# Patient Record
Sex: Female | Born: 1975 | Race: White | Hispanic: No | Smoking: Current every day smoker
Health system: Southern US, Community
[De-identification: ages and names within clinical notes are randomized; demographics above are authoritative.]

## PROBLEM LIST (undated history)

## (undated) DIAGNOSIS — Z9221 Personal history of antineoplastic chemotherapy: Secondary | ICD-10-CM

## (undated) DIAGNOSIS — J45909 Unspecified asthma, uncomplicated: Secondary | ICD-10-CM

## (undated) DIAGNOSIS — F32A Depression, unspecified: Secondary | ICD-10-CM

## (undated) DIAGNOSIS — C801 Malignant (primary) neoplasm, unspecified: Secondary | ICD-10-CM

## (undated) DIAGNOSIS — C50919 Malignant neoplasm of unspecified site of unspecified female breast: Secondary | ICD-10-CM

## (undated) DIAGNOSIS — Z923 Personal history of irradiation: Secondary | ICD-10-CM

## (undated) HISTORY — DX: Depression, unspecified: F32.A

## (undated) HISTORY — PX: ABDOMINAL HYSTERECTOMY: SHX81

## (undated) HISTORY — PX: BREAST SURGERY: SHX581

---

## 2001-11-02 ENCOUNTER — Other Ambulatory Visit: Admission: RE | Admit: 2001-11-02 | Discharge: 2001-11-02 | Payer: Self-pay | Admitting: Family Medicine

## 2002-08-31 DIAGNOSIS — Z9221 Personal history of antineoplastic chemotherapy: Secondary | ICD-10-CM

## 2002-08-31 DIAGNOSIS — Z923 Personal history of irradiation: Secondary | ICD-10-CM

## 2002-08-31 DIAGNOSIS — C50919 Malignant neoplasm of unspecified site of unspecified female breast: Secondary | ICD-10-CM

## 2002-08-31 HISTORY — PX: BREAST LUMPECTOMY: SHX2

## 2002-08-31 HISTORY — DX: Malignant neoplasm of unspecified site of unspecified female breast: C50.919

## 2002-08-31 HISTORY — DX: Personal history of antineoplastic chemotherapy: Z92.21

## 2002-08-31 HISTORY — DX: Personal history of irradiation: Z92.3

## 2003-08-23 ENCOUNTER — Other Ambulatory Visit: Payer: Self-pay

## 2004-05-31 ENCOUNTER — Ambulatory Visit: Payer: Self-pay | Admitting: Oncology

## 2004-07-01 ENCOUNTER — Ambulatory Visit: Payer: Self-pay | Admitting: Oncology

## 2004-07-31 ENCOUNTER — Ambulatory Visit: Payer: Self-pay | Admitting: Oncology

## 2004-09-08 ENCOUNTER — Ambulatory Visit: Payer: Self-pay | Admitting: Oncology

## 2004-10-01 ENCOUNTER — Ambulatory Visit: Payer: Self-pay | Admitting: Oncology

## 2004-10-29 ENCOUNTER — Ambulatory Visit: Payer: Self-pay | Admitting: Oncology

## 2004-11-29 ENCOUNTER — Ambulatory Visit: Payer: Self-pay | Admitting: Oncology

## 2004-12-15 ENCOUNTER — Ambulatory Visit: Payer: Self-pay | Admitting: Surgery

## 2005-01-27 ENCOUNTER — Emergency Department: Payer: Self-pay | Admitting: Emergency Medicine

## 2005-01-29 ENCOUNTER — Ambulatory Visit: Payer: Self-pay | Admitting: Oncology

## 2005-02-28 ENCOUNTER — Ambulatory Visit: Payer: Self-pay | Admitting: Oncology

## 2005-05-13 ENCOUNTER — Ambulatory Visit: Payer: Self-pay | Admitting: Oncology

## 2005-05-29 ENCOUNTER — Ambulatory Visit: Payer: Self-pay | Admitting: Oncology

## 2005-05-31 ENCOUNTER — Ambulatory Visit: Payer: Self-pay | Admitting: Oncology

## 2005-08-31 HISTORY — PX: BREAST EXCISIONAL BIOPSY: SUR124

## 2005-09-03 ENCOUNTER — Ambulatory Visit: Payer: Self-pay | Admitting: Oncology

## 2005-10-01 ENCOUNTER — Ambulatory Visit: Payer: Self-pay | Admitting: Oncology

## 2005-10-22 ENCOUNTER — Emergency Department: Payer: Self-pay | Admitting: Emergency Medicine

## 2005-10-22 ENCOUNTER — Other Ambulatory Visit: Payer: Self-pay

## 2005-11-02 ENCOUNTER — Ambulatory Visit: Payer: Self-pay | Admitting: Oncology

## 2005-11-04 ENCOUNTER — Ambulatory Visit: Payer: Self-pay | Admitting: Oncology

## 2005-11-16 ENCOUNTER — Ambulatory Visit: Payer: Self-pay | Admitting: Surgery

## 2005-11-20 ENCOUNTER — Ambulatory Visit: Payer: Self-pay | Admitting: Oncology

## 2005-12-16 ENCOUNTER — Ambulatory Visit: Payer: Self-pay | Admitting: Oncology

## 2005-12-29 ENCOUNTER — Ambulatory Visit: Payer: Self-pay | Admitting: Oncology

## 2006-02-01 ENCOUNTER — Emergency Department: Payer: Self-pay | Admitting: Emergency Medicine

## 2006-02-01 ENCOUNTER — Other Ambulatory Visit: Payer: Self-pay

## 2006-04-01 ENCOUNTER — Ambulatory Visit: Payer: Self-pay | Admitting: Oncology

## 2006-05-01 ENCOUNTER — Ambulatory Visit: Payer: Self-pay | Admitting: Oncology

## 2006-08-02 ENCOUNTER — Ambulatory Visit: Payer: Self-pay | Admitting: Oncology

## 2006-08-20 ENCOUNTER — Ambulatory Visit: Payer: Self-pay | Admitting: Oncology

## 2006-08-31 ENCOUNTER — Ambulatory Visit: Payer: Self-pay | Admitting: Oncology

## 2006-08-31 HISTORY — PX: BREAST EXCISIONAL BIOPSY: SUR124

## 2006-11-12 ENCOUNTER — Ambulatory Visit: Payer: Self-pay | Admitting: Oncology

## 2006-11-23 ENCOUNTER — Ambulatory Visit: Payer: Self-pay | Admitting: Oncology

## 2006-11-29 ENCOUNTER — Ambulatory Visit: Payer: Self-pay | Admitting: Oncology

## 2006-11-30 ENCOUNTER — Ambulatory Visit: Payer: Self-pay | Admitting: Oncology

## 2006-12-09 ENCOUNTER — Ambulatory Visit: Payer: Self-pay | Admitting: Surgery

## 2006-12-22 ENCOUNTER — Ambulatory Visit: Payer: Self-pay | Admitting: Oncology

## 2006-12-30 ENCOUNTER — Ambulatory Visit: Payer: Self-pay | Admitting: Oncology

## 2006-12-30 ENCOUNTER — Encounter: Admission: RE | Admit: 2006-12-30 | Payer: Self-pay | Admitting: Unknown Physician Specialty

## 2007-01-05 ENCOUNTER — Ambulatory Visit: Payer: Self-pay | Admitting: Oncology

## 2007-01-27 ENCOUNTER — Ambulatory Visit: Payer: Self-pay | Admitting: General Surgery

## 2007-01-30 ENCOUNTER — Ambulatory Visit: Payer: Self-pay | Admitting: Radiation Oncology

## 2007-01-30 ENCOUNTER — Ambulatory Visit: Payer: Self-pay | Admitting: Oncology

## 2007-02-02 ENCOUNTER — Ambulatory Visit: Payer: Self-pay | Admitting: General Surgery

## 2007-03-01 ENCOUNTER — Ambulatory Visit: Payer: Self-pay | Admitting: Oncology

## 2007-03-01 ENCOUNTER — Ambulatory Visit: Payer: Self-pay | Admitting: Radiation Oncology

## 2007-04-01 ENCOUNTER — Ambulatory Visit: Payer: Self-pay | Admitting: Oncology

## 2007-04-20 ENCOUNTER — Ambulatory Visit: Payer: Self-pay | Admitting: Oncology

## 2007-05-02 ENCOUNTER — Ambulatory Visit: Payer: Self-pay | Admitting: Oncology

## 2007-07-02 ENCOUNTER — Ambulatory Visit: Payer: Self-pay | Admitting: Oncology

## 2007-07-20 ENCOUNTER — Ambulatory Visit: Payer: Self-pay | Admitting: Oncology

## 2007-08-01 ENCOUNTER — Ambulatory Visit: Payer: Self-pay | Admitting: Oncology

## 2007-09-01 ENCOUNTER — Ambulatory Visit: Payer: Self-pay | Admitting: Oncology

## 2007-10-02 ENCOUNTER — Ambulatory Visit: Payer: Self-pay | Admitting: Oncology

## 2007-10-30 ENCOUNTER — Ambulatory Visit: Payer: Self-pay | Admitting: Oncology

## 2007-12-30 ENCOUNTER — Ambulatory Visit: Payer: Self-pay | Admitting: Oncology

## 2008-01-19 ENCOUNTER — Ambulatory Visit: Payer: Self-pay | Admitting: Oncology

## 2008-01-30 ENCOUNTER — Ambulatory Visit: Payer: Self-pay | Admitting: Oncology

## 2008-02-29 ENCOUNTER — Ambulatory Visit: Payer: Self-pay | Admitting: Oncology

## 2008-05-01 ENCOUNTER — Ambulatory Visit: Payer: Self-pay | Admitting: Oncology

## 2008-05-08 ENCOUNTER — Ambulatory Visit: Payer: Self-pay | Admitting: Oncology

## 2008-05-31 ENCOUNTER — Ambulatory Visit: Payer: Self-pay | Admitting: Oncology

## 2008-09-17 ENCOUNTER — Ambulatory Visit: Payer: Self-pay | Admitting: Oncology

## 2008-10-01 ENCOUNTER — Ambulatory Visit: Payer: Self-pay | Admitting: Oncology

## 2008-10-03 ENCOUNTER — Ambulatory Visit: Payer: Self-pay | Admitting: Oncology

## 2008-10-29 ENCOUNTER — Ambulatory Visit: Payer: Self-pay | Admitting: Oncology

## 2009-02-13 ENCOUNTER — Emergency Department: Payer: Self-pay | Admitting: Emergency Medicine

## 2009-04-16 ENCOUNTER — Ambulatory Visit: Payer: Self-pay | Admitting: Oncology

## 2009-05-01 ENCOUNTER — Ambulatory Visit: Payer: Self-pay | Admitting: Oncology

## 2009-05-08 ENCOUNTER — Ambulatory Visit: Payer: Self-pay | Admitting: Oncology

## 2009-05-31 ENCOUNTER — Ambulatory Visit: Payer: Self-pay | Admitting: Oncology

## 2009-10-29 ENCOUNTER — Ambulatory Visit: Payer: Self-pay | Admitting: Oncology

## 2009-11-05 ENCOUNTER — Ambulatory Visit: Payer: Self-pay | Admitting: Oncology

## 2009-11-29 ENCOUNTER — Ambulatory Visit: Payer: Self-pay | Admitting: Oncology

## 2010-04-17 ENCOUNTER — Ambulatory Visit: Payer: Self-pay | Admitting: Oncology

## 2010-07-03 ENCOUNTER — Ambulatory Visit: Payer: Self-pay | Admitting: Oncology

## 2010-07-31 ENCOUNTER — Ambulatory Visit: Payer: Self-pay | Admitting: Oncology

## 2010-11-18 ENCOUNTER — Emergency Department: Payer: Self-pay | Admitting: Emergency Medicine

## 2011-01-19 ENCOUNTER — Ambulatory Visit: Payer: Self-pay | Admitting: Oncology

## 2011-01-20 LAB — CANCER ANTIGEN 27.29: CA 27.29: 15.7 U/mL (ref 0.0–38.6)

## 2011-01-30 ENCOUNTER — Ambulatory Visit: Payer: Self-pay | Admitting: Oncology

## 2011-04-20 ENCOUNTER — Ambulatory Visit: Payer: Self-pay | Admitting: Oncology

## 2011-07-28 ENCOUNTER — Ambulatory Visit: Payer: Self-pay | Admitting: Oncology

## 2011-07-29 LAB — CANCER ANTIGEN 27.29: CA 27.29: 13.6 U/mL (ref 0.0–38.6)

## 2011-08-01 ENCOUNTER — Ambulatory Visit: Payer: Self-pay | Admitting: Oncology

## 2011-10-03 ENCOUNTER — Emergency Department: Payer: Self-pay | Admitting: Emergency Medicine

## 2011-10-12 ENCOUNTER — Ambulatory Visit: Payer: Self-pay | Admitting: Oncology

## 2011-10-30 ENCOUNTER — Ambulatory Visit: Payer: Self-pay | Admitting: Oncology

## 2012-04-21 ENCOUNTER — Ambulatory Visit: Payer: Self-pay | Admitting: Oncology

## 2012-05-02 ENCOUNTER — Emergency Department: Payer: Self-pay | Admitting: Internal Medicine

## 2012-05-19 ENCOUNTER — Ambulatory Visit: Payer: Self-pay | Admitting: Oncology

## 2012-05-31 ENCOUNTER — Ambulatory Visit: Payer: Self-pay | Admitting: Oncology

## 2013-04-24 ENCOUNTER — Ambulatory Visit: Payer: Self-pay | Admitting: Oncology

## 2013-05-01 ENCOUNTER — Ambulatory Visit: Payer: Self-pay | Admitting: Oncology

## 2013-05-31 ENCOUNTER — Ambulatory Visit: Payer: Self-pay | Admitting: Oncology

## 2013-10-16 ENCOUNTER — Emergency Department: Payer: Self-pay | Admitting: Emergency Medicine

## 2014-06-20 ENCOUNTER — Ambulatory Visit: Payer: Self-pay | Admitting: Oncology

## 2014-06-28 ENCOUNTER — Ambulatory Visit: Payer: Self-pay | Admitting: Oncology

## 2014-07-06 ENCOUNTER — Ambulatory Visit: Payer: Self-pay | Admitting: Oncology

## 2014-07-10 LAB — CANCER ANTIGEN 27.29: CA 27.29: 11.1 U/mL (ref 0.0–38.6)

## 2014-07-31 ENCOUNTER — Ambulatory Visit: Payer: Self-pay | Admitting: Oncology

## 2014-12-14 ENCOUNTER — Other Ambulatory Visit: Payer: Self-pay | Admitting: Oncology

## 2014-12-14 DIAGNOSIS — Z1231 Encounter for screening mammogram for malignant neoplasm of breast: Secondary | ICD-10-CM

## 2015-05-11 ENCOUNTER — Ambulatory Visit: Payer: Medicaid Other

## 2015-05-11 ENCOUNTER — Ambulatory Visit
Admission: EM | Admit: 2015-05-11 | Discharge: 2015-05-11 | Disposition: A | Discharge status: Home / Self Care | Payer: Medicaid Other | Attending: Family Medicine | Admitting: Family Medicine

## 2015-05-11 ENCOUNTER — Encounter: Payer: Self-pay | Admitting: Gynecology

## 2015-05-11 DIAGNOSIS — S299XXA Unspecified injury of thorax, initial encounter: Secondary | ICD-10-CM | POA: Insufficient documentation

## 2015-05-11 DIAGNOSIS — S62604A Fracture of unspecified phalanx of right ring finger, initial encounter for closed fracture: Secondary | ICD-10-CM

## 2015-05-11 DIAGNOSIS — S62635A Displaced fracture of distal phalanx of left ring finger, initial encounter for closed fracture: Secondary | ICD-10-CM | POA: Insufficient documentation

## 2015-05-11 DIAGNOSIS — F17210 Nicotine dependence, cigarettes, uncomplicated: Secondary | ICD-10-CM | POA: Insufficient documentation

## 2015-05-11 DIAGNOSIS — M79642 Pain in left hand: Secondary | ICD-10-CM | POA: Diagnosis present

## 2015-05-11 DIAGNOSIS — R079 Chest pain, unspecified: Secondary | ICD-10-CM | POA: Diagnosis present

## 2015-05-11 DIAGNOSIS — R059 Cough, unspecified: Secondary | ICD-10-CM

## 2015-05-11 DIAGNOSIS — R05 Cough: Secondary | ICD-10-CM | POA: Diagnosis not present

## 2015-05-11 HISTORY — DX: Malignant (primary) neoplasm, unspecified: C80.1

## 2015-05-11 MED ORDER — IBUPROFEN 800 MG PO TABS: 800.0000 mg | ORAL_TABLET | Freq: Three times a day (TID) | ORAL | Status: DC

## 2015-05-11 NOTE — Discharge Instructions (Signed)
Blunt Chest Trauma Blunt chest trauma is an injury caused by a blow to the chest. These chest injuries can be very painful. Blunt chest trauma often results in bruised or broken (fractured) ribs. Most cases of bruised and fractured ribs from blunt chest traumas get better after 1 to 3 weeks of rest and pain medicine. Often, the soft tissue in the chest wall is also injured, causing pain and bruising. Internal organs, such as the heart and lungs, may also be injured. Blunt chest trauma can lead to serious medical problems. This injury requires immediate medical care. CAUSES   Motor vehicle collisions.  Falls.  Physical violence.  Sports injuries. SYMPTOMS   Chest pain. The pain may be worse when you move or breathe deeply.  Shortness of breath.  Lightheadedness.  Bruising.  Tenderness.  Swelling. DIAGNOSIS  Your caregiver will do a physical exam. X-rays may be taken to look for fractures. However, minor rib fractures may not show up on X-rays until a few days after the injury. If a more serious injury is suspected, further imaging tests may be done. This may include ultrasounds, computed tomography (CT) scans, or magnetic resonance imaging (MRI). TREATMENT  Treatment depends on the severity of your injury. Your caregiver may prescribe pain medicines and deep breathing exercises. HOME CARE INSTRUCTIONS  Limit your activities until you can move around without much pain.  Do not do any strenuous work until your injury is healed.  Put ice on the injured area.  Put ice in a plastic bag.  Place a towel between your skin and the bag.  Leave the ice on for 15-20 minutes, 03-04 times a day.  You may wear a rib belt as directed by your caregiver to reduce pain.  Practice deep breathing as directed by your caregiver to keep your lungs clear.  Only take over-the-counter or prescription medicines for pain, fever, or discomfort as directed by your caregiver. SEEK IMMEDIATE MEDICAL  CARE IF:   You have increasing pain or shortness of breath.  You cough up blood.  You have nausea, vomiting, or abdominal pain.  You have a fever.  You feel dizzy, weak, or you faint. MAKE SURE YOU:  Understand these instructions.  Will watch your condition.  Will get help right away if you are not doing well or get worse. Document Released: 09/24/2004 Document Revised: 11/09/2011 Document Reviewed: 06/03/2011 [facility] Patient Information 2015 [place], [place]. This information is not intended to replace advice given to you by your health care provider. Make sure you discuss any questions you have with your health care provider.  Cast or Splint Care Casts and splints support injured limbs and keep bones from moving while they heal. It is important to care for your cast or splint at home.  HOME CARE INSTRUCTIONS  Keep the cast or splint uncovered during the drying period. It can take 24 to 48 hours to dry if it is made of plaster. A fiberglass cast will dry in less than 1 hour.  Do not rest the cast on anything harder than a pillow for the first 24 hours.  Do not put weight on your injured limb or apply pressure to the cast until your health care provider gives you permission.  Keep the cast or splint dry. Wet casts or splints can lose their shape and may not support the limb as well. A wet cast that has lost its shape can also create harmful pressure on your skin when it dries. Also, wet skin can  become infected.  Cover the cast or splint with a plastic bag when bathing or when out in the rain or snow. If the cast is on the trunk of the body, take sponge baths until the cast is removed.  If your cast does become wet, dry it with a towel or a blow dryer on the cool setting only.  Keep your cast or splint clean. Soiled casts may be wiped with a moistened cloth.  Do not place any hard or soft foreign objects under your cast or splint, such as cotton, toilet paper, lotion, or  powder.  Do not try to scratch the skin under the cast with any object. The object could get stuck inside the cast. Also, scratching could lead to an infection. If itching is a problem, use a blow dryer on a cool setting to relieve discomfort.  Do not trim or cut your cast or remove padding from inside of it.  Exercise all joints next to the injury that are not immobilized by the cast or splint. For example, if you have a long leg cast, exercise the hip joint and toes. If you have an arm cast or splint, exercise the shoulder, elbow, thumb, and fingers.  Elevate your injured arm or leg on 1 or 2 pillows for the first 1 to 3 days to decrease swelling and pain.It is best if you can comfortably elevate your cast so it is higher than your heart. SEEK MEDICAL CARE IF:   Your cast or splint cracks.  Your cast or splint is too tight or too loose.  You have unbearable itching inside the cast.  Your cast becomes wet or develops a soft spot or area.  You have a bad smell coming from inside your cast.  You get an object stuck under your cast.  Your skin around the cast becomes red or raw.  You have new pain or worsening pain after the cast has been applied. SEEK IMMEDIATE MEDICAL CARE IF:   You have fluid leaking through the cast.  You are unable to move your fingers or toes.  You have discolored (blue or white), cool, painful, or very swollen fingers or toes beyond the cast.  You have tingling or numbness around the injured area.  You have severe pain or pressure under the cast.  You have any difficulty with your breathing or have shortness of breath.  You have chest pain. Document Released: 08/14/2000 Document Revised: 06/07/2013 Document Reviewed: 02/23/2013 [facility] Patient Information 2015 [place], [place]. This information is not intended to replace advice given to you by your health care provider. Make sure you discuss any questions you have with your health care provider. Hand  Contusion A hand contusion is a deep bruise on your hand area. Contusions are the result of an injury that caused bleeding under the skin. The contusion may turn blue, purple, or yellow. Minor injuries will give you a painless contusion, but more severe contusions may stay painful and swollen for a few weeks. CAUSES  A contusion is usually caused by a blow, trauma, or direct force to an area of the body. SYMPTOMS   Swelling and redness of the injured area.  Discoloration of the injured area.  Tenderness and soreness of the injured area.  Pain. DIAGNOSIS  The diagnosis can be made by taking a history and performing a physical exam. An X-ray, CT scan, or MRI may be needed to determine if there were any associated injuries, such as broken bones (fractures). TREATMENT  Often, the best treatment for a hand contusion is resting, elevating, icing, and applying cold compresses to the injured area. Over-the-counter medicines may also be recommended for pain control. HOME CARE INSTRUCTIONS   Put ice on the injured area.  Put ice in a plastic bag.  Place a towel between your skin and the bag.  Leave the ice on for 15-20 minutes, 03-04 times a day.  Only take over-the-counter or prescription medicines as directed by your caregiver. Your caregiver may recommend avoiding anti-inflammatory medicines (aspirin, ibuprofen, and naproxen) for 48 hours because these medicines may increase bruising.  If told, use an elastic wrap as directed. This can help reduce swelling. You may remove the wrap for sleeping, showering, and bathing. If your fingers become numb, cold, or blue, take the wrap off and reapply it more loosely.  Elevate your hand with pillows to reduce swelling.  Avoid overusing your hand if it is painful. SEEK IMMEDIATE MEDICAL CARE IF:   You have increased redness, swelling, or pain in your hand.  Your swelling or pain is not relieved with medicines.  You have loss of feeling in your  hand or are unable to move your fingers.  Your hand turns cold or blue.  You have pain when you move your fingers.  Your hand becomes warm to the touch.  Your contusion does not improve in 2 days. MAKE SURE YOU:   Understand these instructions.  Will watch your condition.  Will get help right away if you are not doing well or get worse. Document Released: 02/06/2002 Document Revised: 05/11/2012 Document Reviewed: 02/08/2012 [facility] Patient Information 2015 [place], [place]. This information is not intended to replace advice given to you by your health care provider. Make sure you discuss any questions you have with your health care provider. Chest Contusion A chest contusion is a deep bruise on your chest area. Contusions are the result of an injury that caused bleeding under the skin. A chest contusion may involve bruising of the skin, muscles, or ribs. The contusion may turn blue, purple, or yellow. Minor injuries will give you a painless contusion, but more severe contusions may stay painful and swollen for a few weeks. CAUSES  A contusion is usually caused by a blow, trauma, or direct force to an area of the body. SYMPTOMS   Swelling and redness of the injured area.  Discoloration of the injured area.  Tenderness and soreness of the injured area.  Pain. DIAGNOSIS  The diagnosis can be made by taking a history and performing a physical exam. An X-ray, CT scan, or MRI may be needed to determine if there were any associated injuries, such as broken bones (fractures) or internal injuries. TREATMENT  Often, the best treatment for a chest contusion is resting, icing, and applying cold compresses to the injured area. Deep breathing exercises may be recommended to reduce the risk of pneumonia. Over-the-counter medicines may also be recommended for pain control. HOME CARE INSTRUCTIONS   Put ice on the injured area.  Put ice in a plastic bag.  Place a towel between your skin and  the bag.  Leave the ice on for 15-20 minutes, 03-04 times a day.  Only take over-the-counter or prescription medicines as directed by your caregiver. Your caregiver may recommend avoiding anti-inflammatory medicines (aspirin, ibuprofen, and naproxen) for 48 hours because these medicines may increase bruising.  Rest the injured area.  Perform deep-breathing exercises as directed by your caregiver.  Stop smoking if you smoke.  Do  not lift objects over 5 pounds (2.3 kg) for 3 days or longer if recommended by your caregiver. SEEK IMMEDIATE MEDICAL CARE IF:   You have increased bruising or swelling.  You have pain that is getting worse.  You have difficulty breathing.  You have dizziness, weakness, or fainting.  You have blood in your urine or stool.  You cough up or vomit blood.  Your swelling or pain is not relieved with medicines. MAKE SURE YOU:   Understand these instructions.  Will watch your condition.  Will get help right away if you are not doing well or get worse. Document Released: 05/12/2001 Document Revised: 05/11/2012 Document Reviewed: 02/08/2012 [facility] Patient Information 2015 [place], [place]. This information is not intended to replace advice given to you by your health care provider. Make sure you discuss any questions you have with your health care provider. Finger Fracture (Phalangeal) A broken bone of the finger (phalangealfracture) is a common injury for athletes. A single injury (trauma) is likely to fracture multiple bones on the same or different fingers. SYMPTOMS   Severe pain, at the time of injury.  Pain, tenderness, swelling, and later bruising of the finger and then the hand.  Visible deformity, if the fracture is complete and the bone fragments separate enough to distort the normal shape.  Numbness or coldness from swelling in the finger, causing pressure on blood vessels or nerves (uncommon). CAUSES  Direct or indirect injury (trauma) to  the finger.  RISK INCREASES WITH:   Contact sports (football, rugby) or other sports where injury to the hand is likely (soccer, baseball, basketball).  Sports that require hitting (boxing, martial arts).  History of bone or joint disease, such as osteoporosis, or previous bone restraint.  Poor hand strength and flexibility. PREVENTION   For contact sports, wear appropriate and properly fitted protective equipment for the hand.  Learn and use proper technique when hitting, punching, or landing after a fall.  If you had a previous finger injury or hand restraint, use tape or padding to protect the finger when playing sports where finger injury is likely. PROGNOSIS  With proper treatment and normal alignment of the bones, healing can usually be expected in 4 to 6 weeks. Sometimes, surgery is needed.  RELATED COMPLICATIONS   Fracture does not heal (nonunion).  Bone heals in wrong position (malunion).  Chronic pain, stiffness, or swelling of the hand.  Excessive bleeding, causing pressure on nerves and blood vessels.  Unstable or arthritic joint, following repeated injury or delayed treatment.  Hindrance of normal growth in children.  Infection in skin broken over the fracture (open fracture) or at the incision or pin sites from surgery.  Shortening of injured bones.  Bony bumps or loss of shape of the fingers.  Arthritic or stiff finger joint, if the fracture reaches the joint. TREATMENT  If the bones are properly aligned, treatment involves ice and medicine to reduce pain and inflammation. Then, the finger is restrained for 4 or more weeks, to allow for healing. If the fracture is out of alignment (displaced), involves more than one bone, or involves a joint, surgery is usually advised. Surgery often involves placing removable pins, screws, and sometimes plates, to hold the bones in proper alignment. After restraint (with or without surgery), stretching and strengthening  exercises are needed. Exercises may be completed at home or with a therapist. For certain sports, wearing a splint or having the finger taped during future activity is advised.  MEDICATION   If pain  medicine is needed, nonsteroidal anti-inflammatory medicines (aspirin and ibuprofen), or other minor pain relievers (acetaminophen), are often advised.  Do not take pain medicine for 7 days before surgery.  Prescription pain relievers are usually prescribed only after surgery. Use only as directed and only as much as you need. COLD THERAPY   Cold treatment (icing) relieves pain and reduces inflammation. Cold treatment should be applied for 10 to 15 minutes every 2 to 3 hours, and immediately after activity that aggravates your symptoms. Use ice packs or an ice massage. SEEK MEDICAL CARE IF:   Pain, tenderness, or swelling gets worse, despite treatment.  You experience pain, numbness, or coldness in the hand.  Blue, gray, or dark color appears in the fingernails.  Any of the following occur after surgery: fever, increased pain, swelling, redness, drainage of fluids, or bleeding in the affected area.  New, unexplained symptoms develop. (Drugs used in treatment may produce side effects.) Document Released: 08/17/2005 Document Revised: 11/09/2011 Document Reviewed: 11/29/2008 [facility] Patient Information 2015 [place], [place]. This information is not intended to replace advice given to you by your health care provider. Make sure you discuss any questions you have with your health care provider.
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
## 2015-06-14 ENCOUNTER — Emergency Department
Admission: EM | Admit: 2015-06-14 | Discharge: 2015-06-14 | Disposition: A | Discharge status: Home / Self Care | Payer: No Typology Code available for payment source | Attending: Emergency Medicine | Admitting: Emergency Medicine

## 2015-06-14 ENCOUNTER — Emergency Department: Payer: No Typology Code available for payment source

## 2015-06-14 ENCOUNTER — Encounter: Payer: Self-pay | Admitting: Emergency Medicine

## 2015-06-14 DIAGNOSIS — M62838 Other muscle spasm: Secondary | ICD-10-CM | POA: Diagnosis not present

## 2015-06-14 DIAGNOSIS — Z79899 Other long term (current) drug therapy: Secondary | ICD-10-CM | POA: Diagnosis not present

## 2015-06-14 DIAGNOSIS — Y9389 Activity, other specified: Secondary | ICD-10-CM | POA: Diagnosis not present

## 2015-06-14 DIAGNOSIS — Y92410 Unspecified street and highway as the place of occurrence of the external cause: Secondary | ICD-10-CM | POA: Diagnosis not present

## 2015-06-14 DIAGNOSIS — S4991XA Unspecified injury of right shoulder and upper arm, initial encounter: Secondary | ICD-10-CM | POA: Diagnosis not present

## 2015-06-14 DIAGNOSIS — M62830 Muscle spasm of back: Secondary | ICD-10-CM | POA: Diagnosis not present

## 2015-06-14 DIAGNOSIS — Z720 Tobacco use: Secondary | ICD-10-CM | POA: Insufficient documentation

## 2015-06-14 DIAGNOSIS — S6000XA Contusion of unspecified finger without damage to nail, initial encounter: Secondary | ICD-10-CM

## 2015-06-14 DIAGNOSIS — S6992XA Unspecified injury of left wrist, hand and finger(s), initial encounter: Secondary | ICD-10-CM | POA: Diagnosis present

## 2015-06-14 DIAGNOSIS — Y998 Other external cause status: Secondary | ICD-10-CM | POA: Insufficient documentation

## 2015-06-14 DIAGNOSIS — S60042A Contusion of left ring finger without damage to nail, initial encounter: Secondary | ICD-10-CM | POA: Diagnosis not present

## 2015-06-14 MED ORDER — CYCLOBENZAPRINE HCL 10 MG PO TABS: 10.0000 mg | ORAL_TABLET | Freq: Three times a day (TID) | ORAL | Status: DC | PRN

## 2015-06-14 MED ORDER — MELOXICAM 15 MG PO TABS: 15.0000 mg | ORAL_TABLET | Freq: Every day | ORAL | Status: DC
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
## 2015-06-14 NOTE — Discharge Instructions (Signed)
Back Exercises The following exercises strengthen the muscles that help to support the back. They also help to keep the lower back flexible. Doing these exercises can help to prevent back pain or lessen existing pain. If you have back pain or discomfort, try doing these exercises 2-3 times each day or as told by your health care provider. When the pain goes away, do them once each day, but increase the number of times that you repeat the steps for each exercise (do more repetitions). If you do not have back pain or discomfort, do these exercises once each day or as told by your health care provider. EXERCISES Single Knee to Chest Repeat these steps 3-5 times for each leg:  Lie on your back on a firm bed or the floor with your legs extended.  Bring one knee to your chest. Your other leg should stay extended and in contact with the floor.  Hold your knee in place by grabbing your knee or thigh.  Pull on your knee until you feel a gentle stretch in your lower back.  Hold the stretch for 10-30 seconds.  Slowly release and straighten your leg. Pelvic Tilt Repeat these steps 5-10 times:  Lie on your back on a firm bed or the floor with your legs extended.  Bend your knees so they are pointing toward the ceiling and your feet are flat on the floor.  Tighten your lower abdominal muscles to press your lower back against the floor. This motion will tilt your pelvis so your tailbone points up toward the ceiling instead of pointing to your feet or the floor.  With gentle tension and even breathing, hold this position for 5-10 seconds. Cat-Cow Repeat these steps until your lower back becomes more flexible:  Get into a hands-and-knees position on a firm surface. Keep your hands under your shoulders, and keep your knees under your hips. You may place padding under your knees for comfort.  Let your head hang down, and point your tailbone toward the floor so your lower back becomes rounded like the  back of a cat.  Hold this position for 5 seconds.  Slowly lift your head and point your tailbone up toward the ceiling so your back forms a sagging arch like the back of a cow.  Hold this position for 5 seconds. Press-Ups Repeat these steps 5-10 times:  Lie on your abdomen (face-down) on the floor.  Place your palms near your head, about shoulder-width apart.  While you keep your back as relaxed as possible and keep your hips on the floor, slowly straighten your arms to raise the top half of your body and lift your shoulders. Do not use your back muscles to raise your upper torso. You may adjust the placement of your hands to make yourself more comfortable.  Hold this position for 5 seconds while you keep your back relaxed.  Slowly return to lying flat on the floor. Bridges Repeat these steps 10 times:  Lie on your back on a firm surface.  Bend your knees so they are pointing toward the ceiling and your feet are flat on the floor.  Tighten your buttocks muscles and lift your buttocks off of the floor until your waist is at almost the same height as your knees. You should feel the muscles working in your buttocks and the back of your thighs. If you do not feel these muscles, slide your feet 1-2 inches farther away from your buttocks.  Hold this position for 3-5  seconds.  Slowly lower your hips to the starting position, and allow your buttocks muscles to relax completely. If this exercise is too easy, try doing it with your arms crossed over your chest. Abdominal Crunches Repeat these steps 5-10 times: 1. Lie on your back on a firm bed or the floor with your legs extended. 2. Bend your knees so they are pointing toward the ceiling and your feet are flat on the floor. 3. Cross your arms over your chest. 4. Tip your chin slightly toward your chest without bending your neck. 5. Tighten your abdominal muscles and slowly raise your trunk (torso) high enough to lift your shoulder blades  a tiny bit off of the floor. Avoid raising your torso higher than that, because it can put too much stress on your low back and it does not help to strengthen your abdominal muscles. 6. Slowly return to your starting position. Back Lifts Repeat these steps 5-10 times: 1. Lie on your abdomen (face-down) with your arms at your sides, and rest your forehead on the floor. 2. Tighten the muscles in your legs and your buttocks. 3. Slowly lift your chest off of the floor while you keep your hips pressed to the floor. Keep the back of your head in line with the curve in your back. Your eyes should be looking at the floor. 4. Hold this position for 3-5 seconds. 5. Slowly return to your starting position. SEEK MEDICAL CARE IF:  Your back pain or discomfort gets much worse when you do an exercise.  Your back pain or discomfort does not lessen within 2 hours after you exercise. If you have any of these problems, stop doing these exercises right away. Do not do them again unless your health care provider says that you can. SEEK IMMEDIATE MEDICAL CARE IF:  You develop sudden, severe back pain. If this happens, stop doing the exercises right away. Do not do them again unless your health care provider says that you can.   This information is not intended to replace advice given to you by your health care provider. Make sure you discuss any questions you have with your health care provider.   Document Released: 09/24/2004 Document Revised: 05/08/2015 Document Reviewed: 10/11/2014 Elsevier Interactive Patient Education 2016 [place] therapy can help ease sore, stiff, injured, and tight muscles and joints. Heat relaxes your muscles, which may help ease your pain.  RISKS AND COMPLICATIONS If you have any of the following conditions, do not use heat therapy unless your health care provider has approved:  Poor circulation.  Healing wounds or scarred skin in the area being  treated.  Diabetes, heart disease, or high blood pressure.  Not being able to feel (numbness) the area being treated.  Unusual swelling of the area being treated.  Active infections.  Blood clots.  Cancer.  Inability to communicate pain. This may include young children and people who have problems with their brain function (dementia).  Pregnancy. Heat therapy should only be used on old, pre-existing, or long-lasting (chronic) injuries. Do not use heat therapy on new injuries unless directed by your health care provider. HOW TO USE HEAT THERAPY There are several different kinds of heat therapy, including:  Moist heat pack.  Warm water bath.  Hot water bottle.  Electric heating pad.  Heated gel pack.  Heated wrap.  Electric heating pad. Use the heat therapy method suggested by your health care provider. Follow your health care provider's instructions on when  and how to use heat therapy. GENERAL HEAT THERAPY RECOMMENDATIONS  Do not sleep while using heat therapy. Only use heat therapy while you are awake.  Your skin may turn pink while using heat therapy. Do not use heat therapy if your skin turns red.  Do not use heat therapy if you have new pain.  High heat or long exposure to heat can cause burns. Be careful when using heat therapy to avoid burning your skin.  Do not use heat therapy on areas of your skin that are already irritated, such as with a rash or sunburn. SEEK MEDICAL CARE IF:  You have blisters, redness, swelling, or numbness.  You have new pain.  Your pain is worse. MAKE SURE YOU:  Understand these instructions.  Will watch your condition.  Will get help right away if you are not doing well or get worse.   This information is not intended to replace advice given to you by your health care provider. Make sure you discuss any questions you have with your health care provider.   Document Released: 11/09/2011 Document Revised: 09/07/2014 Document  Reviewed: 10/10/2013 Elsevier Interactive Patient Education 2016 [employer]  Muscle Cramps and Spasms Muscle cramps and spasms occur when a muscle or muscles tighten and you have no control over this tightening (involuntary muscle contraction). They are a common problem and can develop in any muscle. The most common place is in the calf muscles of the leg. Both muscle cramps and muscle spasms are involuntary muscle contractions, but they also have differences:   Muscle cramps are sporadic and painful. They may last a few seconds to a quarter of an hour. Muscle cramps are often more forceful and last longer than muscle spasms.  Muscle spasms may or may not be painful. They may also last just a few seconds or much longer. CAUSES  It is uncommon for cramps or spasms to be due to a serious underlying problem. In many cases, the cause of cramps or spasms is unknown. Some common causes are:   Overexertion.   Overuse from repetitive motions (doing the same thing over and over).   Remaining in a certain position for a long period of time.   Improper preparation, form, or technique while performing a sport or activity.   Dehydration.   Injury.   Side effects of some medicines.   Abnormally low levels of the salts and ions in your blood (electrolytes), especially potassium and calcium. This could happen if you are taking water pills (diuretics) or you are pregnant.  Some underlying medical problems can make it more likely to develop cramps or spasms. These include, but are not limited to:   Diabetes.   Parkinson disease.   Hormone disorders, such as thyroid problems.   Alcohol abuse.   Diseases specific to muscles, joints, and bones.   Blood vessel disease where not enough blood is getting to the muscles.  HOME CARE INSTRUCTIONS   Stay well hydrated. Drink enough water and fluids to keep your urine clear or pale yellow.  It may be helpful to massage, stretch, and  relax the affected muscle.  For tight or tense muscles, use a warm towel, heating pad, or hot shower water directed to the affected area.  If you are sore or have pain after a cramp or spasm, applying ice to the affected area may relieve discomfort.  Put ice in a plastic bag.  Place a towel between your skin and the bag.  Leave the ice  on for 15-20 minutes, 03-04 times a day.  Medicines used to treat a known cause of cramps or spasms may help reduce their frequency or severity. Only take over-the-counter or prescription medicines as directed by your caregiver. SEEK MEDICAL CARE IF:  Your cramps or spasms get more severe, more frequent, or do not improve over time.  MAKE SURE YOU:   Understand these instructions.  Will watch your condition.  Will get help right away if you are not doing well or get worse.   This information is not intended to replace advice given to you by your health care provider. Make sure you discuss any questions you have with your health care provider.   Document Released: 02/06/2002 Document Revised: 12/12/2012 Document Reviewed: 08/03/2012 Elsevier Interactive Patient Education [employer].
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
## 2015-06-25 ENCOUNTER — Ambulatory Visit
Admission: RE | Admit: 2015-06-25 | Discharge: 2015-06-25 | Disposition: A | Discharge status: Home / Self Care | Payer: Medicaid Other | Source: Ambulatory Visit | Attending: Oncology | Admitting: Oncology

## 2015-06-25 DIAGNOSIS — Z1231 Encounter for screening mammogram for malignant neoplasm of breast: Secondary | ICD-10-CM | POA: Insufficient documentation

## 2015-06-25 HISTORY — DX: Malignant neoplasm of unspecified site of unspecified female breast: C50.919

## 2015-07-03 ENCOUNTER — Inpatient Hospital Stay: Payer: No Typology Code available for payment source

## 2015-07-03 ENCOUNTER — Inpatient Hospital Stay: Payer: No Typology Code available for payment source | Admitting: Oncology

## 2015-07-15 ENCOUNTER — Other Ambulatory Visit: Payer: Self-pay | Admitting: *Deleted

## 2015-07-15 DIAGNOSIS — Z853 Personal history of malignant neoplasm of breast: Secondary | ICD-10-CM

## 2015-07-16 ENCOUNTER — Inpatient Hospital Stay: Payer: Medicaid Other | Attending: Oncology | Admitting: Oncology

## 2015-07-16 ENCOUNTER — Emergency Department
Admission: EM | Admit: 2015-07-16 | Discharge: 2015-07-16 | Disposition: A | Discharge status: Home / Self Care | Payer: Medicaid Other | Attending: Emergency Medicine | Admitting: Emergency Medicine

## 2015-07-16 ENCOUNTER — Emergency Department: Payer: Medicaid Other

## 2015-07-16 ENCOUNTER — Inpatient Hospital Stay: Payer: Medicaid Other

## 2015-07-16 ENCOUNTER — Encounter: Payer: Self-pay | Admitting: *Deleted

## 2015-07-16 VITALS — BP 99/61 | HR 71 | Temp 96.7°F | Resp 16 | Wt 124.3 lb

## 2015-07-16 DIAGNOSIS — Z9221 Personal history of antineoplastic chemotherapy: Secondary | ICD-10-CM | POA: Diagnosis not present

## 2015-07-16 DIAGNOSIS — F17210 Nicotine dependence, cigarettes, uncomplicated: Secondary | ICD-10-CM | POA: Diagnosis not present

## 2015-07-16 DIAGNOSIS — Z853 Personal history of malignant neoplasm of breast: Secondary | ICD-10-CM | POA: Diagnosis not present

## 2015-07-16 DIAGNOSIS — Z79899 Other long term (current) drug therapy: Secondary | ICD-10-CM | POA: Diagnosis not present

## 2015-07-16 DIAGNOSIS — F17200 Nicotine dependence, unspecified, uncomplicated: Secondary | ICD-10-CM | POA: Insufficient documentation

## 2015-07-16 DIAGNOSIS — M545 Low back pain, unspecified: Secondary | ICD-10-CM

## 2015-07-16 DIAGNOSIS — Z923 Personal history of irradiation: Secondary | ICD-10-CM | POA: Diagnosis not present

## 2015-07-16 DIAGNOSIS — C50911 Malignant neoplasm of unspecified site of right female breast: Secondary | ICD-10-CM

## 2015-07-16 NOTE — ED Notes (Signed)
Back pain past few weeks from [facility]

## 2015-07-16 NOTE — Discharge Instructions (Signed)
Return to the emergency department for any worsening pain, any weakness or numbness, any incontinence of urine or stool, fever, abdominal pain, or any other symptoms concerning to you.   Back Pain, Adult Back pain is very common in adults.The cause of back pain is rarely dangerous and the pain often gets better over time.The cause of your back pain may not be known. Some common causes of back pain include:  Strain of the muscles or ligaments supporting the spine.  Wear and tear (degeneration) of the spinal disks.  Arthritis.  Direct injury to the back. For many people, back pain may return. Since back pain is rarely dangerous, most people can learn to manage this condition on their own. HOME CARE INSTRUCTIONS Watch your back pain for any changes. The following actions may help to lessen any discomfort you are feeling:  Remain active. It is stressful on your back to sit or stand in one place for long periods of time. Do not sit, drive, or stand in one place for more than 30 minutes at a time. Take short walks on even surfaces as soon as you are able.Try to increase the length of time you walk each day.  Exercise regularly as directed by your health care provider. Exercise helps your back heal faster. It also helps avoid future injury by keeping your muscles strong and flexible.  Do not stay in bed.Resting more than 1-2 days can delay your recovery.  Pay attention to your body when you bend and lift. The most comfortable positions are those that put less stress on your recovering back. Always use proper lifting techniques, including:  Bending your knees.  Keeping the load close to your body.  Avoiding twisting.  Find a comfortable position to sleep. Use a firm mattress and lie on your side with your knees slightly bent. If you lie on your back, put a pillow under your knees.  Avoid feeling anxious or stressed.Stress increases muscle tension and can worsen back pain.It is  important to recognize when you are anxious or stressed and learn ways to manage it, such as with exercise.  Take medicines only as directed by your health care provider. Over-the-counter medicines to reduce pain and inflammation are often the most helpful.Your health care provider may prescribe muscle relaxant drugs.These medicines help dull your pain so you can more quickly return to your normal activities and healthy exercise.  Apply ice to the injured area:  Put ice in a plastic bag.  Place a towel between your skin and the bag.  Leave the ice on for 20 minutes, 2-3 times a day for the first 2-3 days. After that, ice and heat may be alternated to reduce pain and spasms.  Maintain a healthy weight. Excess weight puts extra stress on your back and makes it difficult to maintain good posture. SEEK MEDICAL CARE IF:  You have pain that is not relieved with rest or medicine.  You have increasing pain going down into the legs or buttocks.  You have pain that does not improve in one week.  You have night pain.  You lose weight.  You have a fever or chills. SEEK IMMEDIATE MEDICAL CARE IF:   You develop new bowel or bladder control problems.  You have unusual weakness or numbness in your arms or legs.  You develop nausea or vomiting.  You develop abdominal pain.  You feel faint.   This information is not intended to replace advice given to you by your health care  provider. Make sure you discuss any questions you have with your health care provider.   Document Released: 08/17/2005 Document Revised: 09/07/2014 Document Reviewed: 12/19/2013 Elsevier Interactive Patient Education [employer].

## 2015-07-16 NOTE — ED Notes (Addendum)
Pt eating snack and drinking soda upon entering room. Pt advised to stay NPO until seen by MD
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
## 2015-07-17 LAB — CANCER ANTIGEN 27.29: CA 27.29: 12.8 U/mL (ref 0.0–38.6)

## 2015-07-31 NOTE — Progress Notes (Signed)
[place]  [phone] Fax:(336) [phone]  ID: Frances Adams OB: [birth year]  MR#: [id number]  TLX#:[id number]  Patient Care Team: [place] as PCP - General (General Practice)  CHIEF COMPLAINT:  Chief Complaint  Patient presents with  . Breast Cancer    INTERVAL HISTORY: Patient returns to clinic today for routine yearly evaluation. She continues to feel well and at her baseline. She recently was married.  She denies any fevers. She continues to have a flat affect.  She has no neurologic complaints.  She has no chest pain or shortness of breath.  She has a fair appetite and denies weight loss. She has no nausea, vomiting, constipation, or  diarrhea. Patient offers no specific complaints today.   REVIEW OF SYSTEMS:   Review of Systems  Constitutional: Negative.   Respiratory: Negative.   Gastrointestinal: Negative.   Musculoskeletal: Negative.   Neurological: Negative.   Psychiatric/Behavioral: Positive for depression and substance abuse.    As per HPI. Otherwise, a complete review of systems is negatve.  PAST MEDICAL HISTORY: Past Medical History  Diagnosis Date  . Cancer ([facility])     RT BREAST  . Breast cancer [facility]) 2004    right breast cancer - chemotherapy and radiation  . Breast cancer [facility]) 2009    right breast cancer - radiation    PAST SURGICAL HISTORY: Past Surgical History  Procedure Laterality Date  . Breast surgery    . Abdominal hysterectomy    . Breast biopsy Right 2009    Stereotactic biopsy (+)    FAMILY HISTORY Family History  Problem Relation Age of Onset  . Breast cancer Cousin        ADVANCED DIRECTIVES:    HEALTH MAINTENANCE: Social History  Substance Use Topics  . Smoking status: Current Every Day Smoker -- 0.50 packs/day  . Smokeless tobacco: Not on file  . Alcohol Use: Yes     Colonoscopy:  PAP:  Bone density:  Lipid panel:  No Known Allergies  Current  Outpatient Prescriptions  Medication Sig Dispense Refill  . THYROID PO Take by mouth.     No current facility-administered medications for this visit.    OBJECTIVE: Filed Vitals:   07/16/15 1025  BP: 99/61  Pulse: 71  Temp: 96.7 F (35.9 C)  Resp: 16     Body mass index is 22.74 kg/(m^2).    ECOG FS:0 - Asymptomatic  General: Well-developed, well-nourished, no acute distress. Eyes: Pink conjunctiva, anicteric sclera. Breasts: Bilateral breast and axilla without lumps or masses. Lungs: Clear to auscultation bilaterally. Heart: Regular rate and rhythm. No rubs, murmurs, or gallops. Abdomen: Soft, nontender, nondistended. No organomegaly noted, normoactive bowel sounds. Musculoskeletal: No edema, cyanosis, or clubbing. Neuro: Alert, answering all questions appropriately. Cranial nerves grossly intact. Skin: No rashes or petechiae noted. Psych: Normal affect.   LAB RESULTS:  No results found for: NA, K, CL, CO2, GLUCOSE, BUN, CREATININE, CALCIUM, PROT, ALBUMIN, AST, ALT, ALKPHOS, BILITOT, GFRNONAA, GFRAA  No results found for: WBC, NEUTROABS, HGB, HCT, MCV, PLT   STUDIES: Dg Lumbar Spine 2-3 Views  07/16/2015  CLINICAL DATA:  Low back pain EXAM: LUMBAR SPINE - 2-3 VIEW COMPARISON:  None. FINDINGS: Three views of lumbar spine submitted. No acute fracture or subluxation. Alignment, disc spaces and vertebral body heights are preserved. Minimal anterior spurring upper endplate of L3 and L4 vertebral body. IMPRESSION: No acute fracture or subluxation. Minimal anterior spurring upper endplate of L3 and L4 vertebral body. Electronically  Signed   By: Lahoma Crocker M.D.   On: 07/16/2015 19:06    ASSESSMENT:   1.  TXN2M0 tumor diagnosed in 2004, status post axillary node dissection, chemotherapy and radiation therapy. 2.  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 Status post lumpectomy and MammoSite therapy. 3.  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  PLAN:   1. Breast cancer:  Currently, no evidence of disease. She does not require hormone therapy given the ER/PR status of her tumor. Mammogram completed on July 16, 2015 was reported as BI-RADS 1, repeat in one year. Return to clinic in one year for routine evaluation.  Patient will be 10 years removed from treatments in 2018 and likely can be discharged from clinic at that time.  Patient expressed understanding and was in agreement with this plan. She also understands that She can call clinic at any time with any questions, concerns, or complaints.   No matching staging information was found for the patient.  Lloyd Huger, MD   07/31/2015 1:28 PM

## 2016-03-10 ENCOUNTER — Emergency Department: Payer: Medicaid Other

## 2016-03-10 ENCOUNTER — Emergency Department
Admission: EM | Admit: 2016-03-10 | Discharge: 2016-03-10 | Disposition: A | Discharge status: Home / Self Care | Payer: Medicaid Other | Attending: Emergency Medicine | Admitting: Emergency Medicine

## 2016-03-10 ENCOUNTER — Encounter: Payer: Self-pay | Admitting: Emergency Medicine

## 2016-03-10 DIAGNOSIS — Z853 Personal history of malignant neoplasm of breast: Secondary | ICD-10-CM | POA: Diagnosis not present

## 2016-03-10 DIAGNOSIS — R509 Fever, unspecified: Secondary | ICD-10-CM | POA: Diagnosis present

## 2016-03-10 DIAGNOSIS — J069 Acute upper respiratory infection, unspecified: Secondary | ICD-10-CM | POA: Insufficient documentation

## 2016-03-10 DIAGNOSIS — F17200 Nicotine dependence, unspecified, uncomplicated: Secondary | ICD-10-CM | POA: Insufficient documentation

## 2016-03-10 MED ORDER — HYDROCOD POLST-CPM POLST ER 10-8 MG/5ML PO SUER: 5.0000 mL | Freq: Two times a day (BID) | ORAL | Status: DC

## 2016-03-10 NOTE — ED Notes (Signed)
Fever and cough for five days.

## 2016-03-10 NOTE — ED Notes (Signed)
Pt in via triage with complaints of cough, fever x 5 days, reports taking tylenol for fever, dayquil/nyquil for cough. Pt reports cough is worse in the evening and at night.  Pt A/Ox4, in no immediate distress at this time.

## 2016-03-10 NOTE — ED Provider Notes (Signed)
[facility] Emergency Department Provider Note   ____________________________________________  Time seen: Approximately 3:39 PM  I have reviewed the triage vital signs and the nursing notes.   HISTORY  Chief Complaint Fever and Cough    HPI Frances Adams is a 40 y.o. female with a 5 day history of non-productive cough and fever. Tmax 103.0 F. Patient complains of dizziness, ear fullness, and generalized fatigue. Patient denies N/V. Patient states that since onset of symptoms 5 days ago she has progressively gotten worse. She has used Tylenol for the fever and DayQuil/NyQuil for cough. Denies sick contacts. Patient restarted her hypothyroid medication 4 days ago. Past Medical History  Diagnosis Date  . Cancer (HCC)     RT BREAST  . Breast cancer [facility]) 2004    right breast cancer - chemotherapy and radiation  . Breast cancer [facility]) 2009    right breast cancer - radiation    There are no active problems to display for this patient.   Past Surgical History  Procedure Laterality Date  . Breast surgery    . Abdominal hysterectomy    . Breast biopsy Right 2009    Stereotactic biopsy (+)    Current Outpatient Rx  Name  Route  Sig  Dispense  Refill  . chlorpheniramine-HYDROcodone (TUSSIONEX PENNKINETIC ER) 10-8 MG/5ML SUER   Oral   Take 5 mLs by mouth 2 (two) times daily.   115 mL   0   . THYROID PO   Oral   Take by mouth.           Allergies Review of patient's allergies indicates no known allergies.  Family History  Problem Relation Age of Onset  . Breast cancer Cousin     Social History Social History  Substance Use Topics  . Smoking status: Current Every Day Smoker -- 0.50 packs/day  . Smokeless tobacco: None  . Alcohol Use: Yes    Review of Systems Constitutional:Fever/fatigue Eyes: No visual changes. ENT: No sore throat. Ear fullness Cardiovascular: Denies chest pain. Respiratory: Denies shortness of breath.  Nonproductive cough Gastrointestinal: No abdominal pain.  No nausea, no vomiting.  No diarrhea.  No constipation. Genitourinary: Negative for dysuria. Musculoskeletal: Negative for back pain. Skin: Negative for rash. Neurological: Negative for headaches, focal weakness or numbness.    ____________________________________________   PHYSICAL EXAM:  VITAL SIGNS: ED Triage Vitals  Enc Vitals Group     BP 03/10/16 1519 98/59 mmHg     Pulse Rate 03/10/16 1517 93     Resp 03/10/16 1517 20     Temp 03/10/16 1517 98.4 F (36.9 C)     Temp Source 03/10/16 1517 Oral     SpO2 03/10/16 1517 98 %     Weight 03/10/16 1517 124 lb (56.246 kg)     Height 03/10/16 1517 5\' 2"  (1.575 m)     Head Cir --      Peak Flow --      Pain Score --      Pain Loc --      Pain Edu? --      Excl. in [place]? --     Constitutional: Alert and oriented. Well appearing and in no acute distress. Eyes: Conjunctivae are normal. PERRL. EOMI. Head: Atraumatic. Nose: No congestion/rhinnorhea. Mouth/Throat: Mucous membranes are moist.  Oropharynx non-erythematous. Neck: No stridor.  No cervical spine tenderness to palpation. Hematological/Lymphatic/Immunilogical: No cervical lymphadenopathy. Cardiovascular: Normal rate, regular rhythm. Grossly normal heart sounds.  Good peripheral circulation. Respiratory:  Normal respiratory effort.  No retractions. Lungs CTAB. Gastrointestinal: Soft and nontender. No distention. No abdominal bruits. No CVA tenderness. Musculoskeletal: No lower extremity tenderness nor edema.  No joint effusions. Neurologic:  Normal speech and language. No gross focal neurologic deficits are appreciated. No gait instability. Skin:  Skin is warm, dry and intact. No rash noted. Psychiatric: Mood and affect are normal. Speech and behavior are normal.  ____________________________________________   LABS (all labs ordered are listed, but only abnormal results are displayed)  Labs Reviewed - No data  to display ____________________________________________  EKG   ____________________________________________  RADIOLOGY  No acute findings on x-ray of the chest ____________________________________________   PROCEDURES  Procedure(s) performed: None  Procedures  Critical Care performed: No  ____________________________________________   INITIAL IMPRESSION / ASSESSMENT AND PLAN / ED COURSE  Pertinent labs & imaging results that were available during my care of the patient were reviewed by me and considered in my medical decision making (see chart for details).  Cough secondary to upper rest or infection. Discussed negative x-ray finding with patient. Patient given discharge Instructions. Patient given prescription for Tussionex to take as directed. Patient advised to follow-up with family doctor if no improvement 2-3 days. ____________________________________________   FINAL CLINICAL IMPRESSION(S) / ED DIAGNOSES  Final diagnoses:  URI (upper respiratory infection)      NEW MEDICATIONS STARTED DURING THIS VISIT:  New Prescriptions   CHLORPHENIRAMINE-HYDROCODONE (TUSSIONEX PENNKINETIC ER) 10-8 MG/5ML SUER    Take 5 mLs by mouth 2 (two) times daily.     Note:  This document was prepared using Dragon voice recognition software and may include unintentional dictation errors.    Sable Feil, PA-C 03/10/16 1633  Delman Kitten, MD 03/10/16 2122

## 2016-03-10 NOTE — Discharge Instructions (Signed)
Cough, Adult A cough helps to clear your throat and lungs. A cough may last only 2-3 weeks (acute), or it may last longer than 8 weeks (chronic). Many different things can cause a cough. A cough may be a sign of an illness or another medical condition. HOME CARE  Pay attention to any changes in your cough.  Take medicines only as told by your doctor.  If you were prescribed an antibiotic medicine, take it as told by your doctor. Do not stop taking it even if you start to feel better.  Talk with your doctor before you try using a cough medicine.  Drink enough fluid to keep your pee (urine) clear or pale yellow.  If the air is dry, use a cold steam vaporizer or humidifier in your home.  Stay away from things that make you cough at work or at home.  If your cough is worse at night, try using extra pillows to raise your head up higher while you sleep.  Do not smoke, and try not to be around smoke. If you need help quitting, ask your doctor.  Do not have caffeine.  Do not drink alcohol.  Rest as needed. GET HELP IF:  You have new problems (symptoms).  You cough up yellow fluid (pus).  Your cough does not get better after 2-3 weeks, or your cough gets worse.  Medicine does not help your cough and you are not sleeping well.  You have pain that gets worse or pain that is not helped with medicine.  You have a fever.  You are losing weight and you do not know why.  You have night sweats. GET HELP RIGHT AWAY IF:  You cough up blood.  You have trouble breathing.  Your heartbeat is very fast.   This information is not intended to replace advice given to you by your health care provider. Make sure you discuss any questions you have with your health care provider.   Document Released: 04/30/2011 Document Revised: 05/08/2015 Document Reviewed: 10/24/2014 Elsevier Interactive Patient Education 2016 Elsevier Inc[employer]  Upper Respiratory Infection, Adult Most upper respiratory  infections (URIs) are a viral infection of the air passages leading to the lungs. A URI affects the nose, throat, and upper air passages. The most common type of URI is nasopharyngitis and is typically referred to as "the common cold." URIs run their course and usually go away on their own. Most of the time, a URI does not require medical attention, but sometimes a bacterial infection in the upper airways can follow a viral infection. This is called a secondary infection. Sinus and middle ear infections are common types of secondary upper respiratory infections. Bacterial pneumonia can also complicate a URI. A URI can worsen asthma and chronic obstructive pulmonary disease (COPD). Sometimes, these complications can require emergency medical care and may be life threatening.  CAUSES Almost all URIs are caused by viruses. A virus is a type of germ and can spread from one person to another.  RISKS FACTORS You may be at risk for a URI if:   You smoke.   You have chronic heart or lung disease.  You have a weakened defense (immune) system.   You are very young or very old.   You have nasal allergies or asthma.  You work in crowded or poorly ventilated areas.  You work in health care facilities or schools. SIGNS AND SYMPTOMS  Symptoms typically develop 2-3 days after you come in contact with a cold virus.  Most viral URIs last 7-10 days. However, viral URIs from the influenza virus (flu virus) can last 14-18 days and are typically more severe. Symptoms may include:   Runny or stuffy (congested) nose.   Sneezing.   Cough.   Sore throat.   Headache.   Fatigue.   Fever.   Loss of appetite.   Pain in your forehead, behind your eyes, and over your cheekbones (sinus pain).  Muscle aches.  DIAGNOSIS  Your health care provider may diagnose a URI by:  Physical exam.  Tests to check that your symptoms are not due to another condition such as:  Strep  throat.  Sinusitis.  Pneumonia.  Asthma. TREATMENT  A URI goes away on its own with time. It cannot be cured with medicines, but medicines may be prescribed or recommended to relieve symptoms. Medicines may help:  Reduce your fever.  Reduce your cough.  Relieve nasal congestion. HOME CARE INSTRUCTIONS   Take medicines only as directed by your health care provider.   Gargle warm saltwater or take cough drops to comfort your throat as directed by your health care provider.  Use a warm mist humidifier or inhale steam from a shower to increase air moisture. This may make it easier to breathe.  Drink enough fluid to keep your urine clear or pale yellow.   Eat soups and other clear broths and maintain good nutrition.   Rest as needed.   Return to work when your temperature has returned to normal or as your health care provider advises. You may need to stay home longer to avoid infecting others. You can also use a face mask and careful hand washing to prevent spread of the virus.  Increase the usage of your inhaler if you have asthma.   Do not use any tobacco products, including cigarettes, chewing tobacco, or electronic cigarettes. If you need help quitting, ask your health care provider. PREVENTION  The best way to protect yourself from getting a cold is to practice good hygiene.   Avoid oral or hand contact with people with cold symptoms.   Wash your hands often if contact occurs.  There is no clear evidence that vitamin C, vitamin E, echinacea, or exercise reduces the chance of developing a cold. However, it is always recommended to get plenty of rest, exercise, and practice good nutrition.  SEEK MEDICAL CARE IF:   You are getting worse rather than better.   Your symptoms are not controlled by medicine.   You have chills.  You have worsening shortness of breath.  You have brown or red mucus.  You have yellow or brown nasal discharge.  You have pain in your  face, especially when you bend forward.  You have a fever.  You have swollen neck glands.  You have pain while swallowing.  You have white areas in the back of your throat. SEEK IMMEDIATE MEDICAL CARE IF:   You have severe or persistent:  Headache.  Ear pain.  Sinus pain.  Chest pain.  You have chronic lung disease and any of the following:  Wheezing.  Prolonged cough.  Coughing up blood.  A change in your usual mucus.  You have a stiff neck.  You have changes in your:  Vision.  Hearing.  Thinking.  Mood. MAKE SURE YOU:   Understand these instructions.  Will watch your condition.  Will get help right away if you are not doing well or get worse.   This information is not intended to replace  advice given to you by your health care provider. Make sure you discuss any questions you have with your health care provider.   Document Released: 02/10/2001 Document Revised: 01/01/2015 Document Reviewed: 11/22/2013 Elsevier Interactive Patient Education [employer].

## 2016-06-26 ENCOUNTER — Ambulatory Visit: Payer: Medicaid Other | Attending: Oncology

## 2016-06-30 ENCOUNTER — Other Ambulatory Visit: Payer: Self-pay | Admitting: Primary Care

## 2016-06-30 DIAGNOSIS — Z1231 Encounter for screening mammogram for malignant neoplasm of breast: Secondary | ICD-10-CM

## 2016-07-15 ENCOUNTER — Ambulatory Visit: Payer: Medicaid Other | Admitting: Oncology

## 2016-08-04 ENCOUNTER — Ambulatory Visit
Admission: RE | Admit: 2016-08-04 | Discharge: 2016-08-04 | Disposition: A | Discharge status: Home / Self Care | Payer: Self-pay | Source: Ambulatory Visit | Attending: Primary Care | Admitting: Primary Care

## 2016-08-04 DIAGNOSIS — Z1231 Encounter for screening mammogram for malignant neoplasm of breast: Secondary | ICD-10-CM | POA: Insufficient documentation

## 2016-08-09 DIAGNOSIS — Z853 Personal history of malignant neoplasm of breast: Secondary | ICD-10-CM | POA: Insufficient documentation

## 2016-08-09 NOTE — Progress Notes (Deleted)
Frances Adams  Telephone:([phone]) [phone] Fax:(336) [phone]  ID: Frances Adams OB: 01-Nov-1975  MR#: [id number]  VOZ#:[id number]  Patient Care Team: [facility] as PCP - General (General Practice)  CHIEF COMPLAINT: History of breast cancer.  INTERVAL HISTORY: Patient returns to clinic today for routine yearly evaluation. She continues to feel well and at her baseline. She recently was married.  She denies any fevers. She continues to have a flat affect.  She has no neurologic complaints.  She has no chest pain or shortness of breath.  She has a fair appetite and denies weight loss. She has no nausea, vomiting, constipation, or  diarrhea. Patient offers no specific complaints today.   REVIEW OF SYSTEMS:   Review of Systems  Constitutional: Negative.   Respiratory: Negative.   Gastrointestinal: Negative.   Musculoskeletal: Negative.   Neurological: Negative.   Psychiatric/Behavioral: Positive for depression and substance abuse.    As per HPI. Otherwise, a complete review of systems is negatve.  PAST MEDICAL HISTORY: Past Medical History:  Diagnosis Date  . 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 Breast cancer [facility]) 2009   right breast cancer - radiation  . Cancer ([place])    RT BREAST    PAST SURGICAL HISTORY: Past Surgical History:  Procedure Laterality Date  . ABDOMINAL HYSTERECTOMY    . BREAST BIOPSY Right 2009   Stereotactic biopsy (+)  . BREAST SURGERY      FAMILY HISTORY Family History  Problem Relation Age of Onset  . Breast cancer Cousin        ADVANCED DIRECTIVES:    HEALTH MAINTENANCE: Social History  Substance Use Topics  . Smoking status: Current Every Day Smoker    Packs/day: 0.50  . Smokeless tobacco: Not on file  . Alcohol use Yes     Colonoscopy:  PAP:  Bone density:  Lipid panel:  No Known Allergies  Current Outpatient Prescriptions  Medication  Sig Dispense Refill  . chlorpheniramine-HYDROcodone (TUSSIONEX PENNKINETIC ER) 10-8 MG/5ML SUER Take 5 mLs by mouth 2 (two) times daily. 115 mL 0  . THYROID PO Take by mouth.     No current facility-administered medications for this visit.     OBJECTIVE: There were no vitals filed for this visit.   There is no height or weight on file to calculate BMI.    ECOG FS:0 - Asymptomatic  General: Well-developed, well-nourished, no acute distress. Eyes: Pink conjunctiva, anicteric sclera. Breasts: Bilateral breast and axilla without lumps or masses. Lungs: Clear to auscultation bilaterally. Heart: Regular rate and rhythm. No rubs, murmurs, or gallops. Abdomen: Soft, nontender, nondistended. No organomegaly noted, normoactive bowel sounds. Musculoskeletal: No edema, cyanosis, or clubbing. Neuro: Alert, answering all questions appropriately. Cranial nerves grossly intact. Skin: No rashes or petechiae noted. Psych: Normal affect.   LAB RESULTS:  No results found for: NA, K, CL, CO2, GLUCOSE, BUN, CREATININE, CALCIUM, PROT, ALBUMIN, AST, ALT, ALKPHOS, BILITOT, GFRNONAA, GFRAA  No results found for: WBC, NEUTROABS, HGB, HCT, MCV, PLT   STUDIES: Mm Screening Breast Tomo Bilateral  Result Date: 08/04/2016 CLINICAL DATA:  Screening. EXAM: 2D DIGITAL SCREENING BILATERAL MAMMOGRAM WITH CAD AND ADJUNCT TOMO COMPARISON:  Previous exam(s). ACR Breast Density Category b: There are scattered areas of fibroglandular density. FINDINGS: There are no findings suspicious for malignancy. Images were processed with CAD. IMPRESSION: No mammographic evidence of malignancy. A result letter of this screening mammogram will be mailed directly to  the patient. RECOMMENDATION: Screening mammogram in one year. (Code:SM-B-01Y) BI-RADS CATEGORY  1: Negative. Electronically Signed   By: Lajean Manes M.D.   On: 08/04/2016 13:33    ASSESSMENT:   1.  TXN2M0 tumor diagnosed in 2004, status post axillary node dissection,  chemotherapy and radiation therapy. 2.  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 Status post lumpectomy and MammoSite therapy. 3.  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  PLAN:   1. Breast cancer: Currently, no evidence of disease. She does not require hormone therapy given the ER/PR status of her tumor. Mammogram completed on July 16, 2015 was reported as BI-RADS 1, repeat in one year. Return to clinic in one year for routine evaluation.  Patient will be 10 years removed from treatments in 2018 and likely can be discharged from clinic at that time.  Patient expressed understanding and was in agreement with this plan. She also understands that She can call clinic at any time with any questions, concerns, or complaints.   No matching staging information was found for the patient.  Lloyd Huger, MD   08/09/2016 11:37 PM

## 2016-08-11 ENCOUNTER — Inpatient Hospital Stay: Payer: Self-pay | Admitting: Oncology

## 2016-09-10 ENCOUNTER — Ambulatory Visit: Payer: Self-pay | Admitting: Oncology

## 2017-01-30 ENCOUNTER — Emergency Department
Admission: EM | Admit: 2017-01-30 | Discharge: 2017-01-30 | Disposition: A | Discharge status: Home / Self Care | Payer: Self-pay | Attending: Emergency Medicine | Admitting: Emergency Medicine

## 2017-01-30 ENCOUNTER — Emergency Department: Payer: Self-pay

## 2017-01-30 ENCOUNTER — Encounter: Payer: Self-pay | Admitting: Medical Oncology

## 2017-01-30 DIAGNOSIS — Y999 Unspecified external cause status: Secondary | ICD-10-CM | POA: Insufficient documentation

## 2017-01-30 DIAGNOSIS — Z853 Personal history of malignant neoplasm of breast: Secondary | ICD-10-CM | POA: Insufficient documentation

## 2017-01-30 DIAGNOSIS — X79XXXA Intentional self-harm by blunt object, initial encounter: Secondary | ICD-10-CM | POA: Insufficient documentation

## 2017-01-30 DIAGNOSIS — F17200 Nicotine dependence, unspecified, uncomplicated: Secondary | ICD-10-CM | POA: Insufficient documentation

## 2017-01-30 DIAGNOSIS — Y9389 Activity, other specified: Secondary | ICD-10-CM | POA: Insufficient documentation

## 2017-01-30 DIAGNOSIS — Z79899 Other long term (current) drug therapy: Secondary | ICD-10-CM | POA: Insufficient documentation

## 2017-01-30 DIAGNOSIS — Y929 Unspecified place or not applicable: Secondary | ICD-10-CM | POA: Insufficient documentation

## 2017-01-30 DIAGNOSIS — S62306A Unspecified fracture of fifth metacarpal bone, right hand, initial encounter for closed fracture: Secondary | ICD-10-CM | POA: Insufficient documentation

## 2017-01-30 DIAGNOSIS — S62339A Displaced fracture of neck of unspecified metacarpal bone, initial encounter for closed fracture: Secondary | ICD-10-CM

## 2017-01-30 MED ORDER — IBUPROFEN 600 MG PO TABS: 600.0000 mg | ORAL_TABLET | Freq: Three times a day (TID) | ORAL | 0 refills | Status: DC | PRN

## 2017-01-30 MED ORDER — NAPROXEN 500 MG PO TABS: 500.0000 mg | ORAL_TABLET | Freq: Two times a day (BID) | ORAL | Status: DC

## 2017-01-30 NOTE — ED Notes (Signed)
See triage note  States she punched a radio screen on Monday  conts to have pain and swelling to hand

## 2017-01-30 NOTE — Discharge Instructions (Signed)
Wear splint until evaluation by orthopedic Dr.

## 2017-01-30 NOTE — ED Provider Notes (Signed)
[facility] Emergency Department Provider Note   ____________________________________________   First MD Initiated Contact with Patient 01/30/17 1124     (approximate)  I have reviewed the triage vital signs and the nursing notes.   HISTORY  Chief Complaint Hand Injury    HPI [place] is a 41 y.o. female patient complaining of right hand pain secondary to punching a radial 5 days ago. Patient stated there was initial pain and swelling most of the swelling is gone secondary to applied ice and taken ibuprofen. Patient rates the pain as a 3/10. Patient described a pain is achy. Patient stated pain is mostly to the head of the fifth metacarpal.patient is right-hand dominant. Patient denies loss of sensation or function of the right hand.  Past Medical History:  Diagnosis Date  . 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 Breast cancer [facility]) 2009   right breast cancer - radiation  . Cancer [facility])    RT BREAST    Patient Active Problem List   Diagnosis Date Noted  . History of breast cancer 08/09/2016    Past Surgical History:  Procedure Laterality Date  . ABDOMINAL HYSTERECTOMY    . BREAST BIOPSY Right 2009   Stereotactic biopsy (+)  . BREAST SURGERY      Prior to Admission medications   Medication Sig Start Date End Date Taking? Authorizing Provider  chlorpheniramine-HYDROcodone (TUSSIONEX PENNKINETIC ER) 10-8 MG/5ML SUER Take 5 mLs by mouth 2 (two) times daily. 03/10/16   Sable Feil, PA-C  ibuprofen (ADVIL,MOTRIN) 600 MG tablet Take 1 tablet (600 mg total) by mouth every 8 (eight) hours as needed. 01/30/17   Sable Feil, PA-C  naproxen (NAPROSYN) 500 MG tablet Take 1 tablet (500 mg total) by mouth 2 (two) times daily with a meal. 01/30/17   Sable Feil, PA-C  THYROID PO Take by mouth.    [provider]    Allergies Patient has no known allergies.  Family History  Problem  Relation Age of Onset  . Breast cancer Cousin     Social History Social History  Substance Use Topics  . Smoking status: Current Every Day Smoker    Packs/day: 0.50  . Smokeless tobacco: Not on file  . Alcohol use Yes    Review of Systems  Constitutional: No fever/chills Eyes: No visual changes. ENT: No sore throat. Cardiovascular: Denies chest pain. Respiratory: Denies shortness of breath. Gastrointestinal: No abdominal pain.  No nausea, no vomiting.  No diarrhea.  No constipation. Genitourinary: Negative for dysuria. Musculoskeletal: right hand pain. Skin: Negative for rash. Neurological: Negative for headaches, focal weakness or numbness. ____________________________________________   PHYSICAL EXAM:  VITAL SIGNS: ED Triage Vitals  Enc Vitals Group     BP 01/30/17 1116 108/66     Pulse Rate 01/30/17 1116 79     Resp 01/30/17 1116 16     Temp 01/30/17 1116 98.4 F (36.9 C)     Temp Source 01/30/17 1116 Oral     SpO2 01/30/17 1116 100 %     Weight 01/30/17 1114 124 lb (56.2 kg)     Height 01/30/17 1116 5\' 3"  (1.6 m)     Head Circumference --      Peak Flow --      Pain Score --      Pain Loc --      Pain Edu? --      Excl. in [place]? --  Constitutional: Alert and oriented. Well appearing and in no acute distress. **}Cardiovascular: Normal rate, regular rhythm. Grossly normal heart sounds.  Good peripheral circulation. Respiratory: Normal respiratory effort.  No retractions. Lungs CTAB. Musculoskeletal: obvious deformity to the fifth metacarpal head. Neurologic:  Normal speech and language. No gross focal neurologic deficits are appreciated. No gait instability. Skin:  Skin is warm, dry and intact. No rash noted. Psychiatric: Mood and affect are normal. Speech and behavior are normal.  ____________________________________________   LABS (all labs ordered are listed, but only abnormal results are displayed)  Labs Reviewed - No data to  display ____________________________________________  EKG   ____________________________________________  RADIOLOGY  X-rays revealed displaced fracture fifth metatarsal right hand. ____________________________________________   PROCEDURES  Procedure(s) performed: None  Procedures  Critical Care performed: No  ____________________________________________   INITIAL IMPRESSION / ASSESSMENT AND PLAN / ED COURSE  Pertinent labs & imaging results that were available during my care of the patient were reviewed by me and considered in my medical decision making (see chart for details).  Boxer fracture right hand. Discussed x-ray finding with patient. Patient placed in a volar gutter splint and advised to follow orthopedics by calling for an appointment in 2 days.      ____________________________________________   FINAL CLINICAL IMPRESSION(S) / ED DIAGNOSES  Final diagnoses:  Closed boxer's fracture, initial encounter      NEW MEDICATIONS STARTED DURING THIS VISIT:  New Prescriptions   IBUPROFEN (ADVIL,MOTRIN) 600 MG TABLET    Take 1 tablet (600 mg total) by mouth every 8 (eight) hours as needed.   NAPROXEN (NAPROSYN) 500 MG TABLET    Take 1 tablet (500 mg total) by mouth 2 (two) times daily with a meal.     Note:  This document was prepared using Dragon voice recognition software and may include unintentional dictation errors.    Sable Feil, PA-C 01/30/17 1152    Lavonia Drafts, MD 01/30/17 [id number]

## 2017-01-30 NOTE — ED Triage Notes (Signed)
Pt reports she punched a radio with rt hand and injured it Monday.
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
## 2017-11-08 ENCOUNTER — Ambulatory Visit
Admission: RE | Admit: 2017-11-08 | Discharge: 2017-11-08 | Disposition: A | Discharge status: Home / Self Care | Payer: Self-pay | Source: Ambulatory Visit | Attending: Oncology | Admitting: Oncology

## 2017-11-08 ENCOUNTER — Ambulatory Visit: Payer: Self-pay | Attending: Oncology

## 2017-11-08 VITALS — Ht 63.0 in | Wt 119.0 lb

## 2017-11-08 DIAGNOSIS — Z0000 Encounter for general adult medical examination without abnormal findings: Secondary | ICD-10-CM

## 2017-11-08 HISTORY — DX: Personal history of antineoplastic chemotherapy: Z92.21

## 2017-11-08 HISTORY — DX: Personal history of irradiation: Z92.3

## 2017-11-08 NOTE — Progress Notes (Signed)
Letter mailed from [facility] to notify of normal mammogram results.  Patient to return in one year for annual screening.  Copy to [facility]. 

## 2017-11-08 NOTE — Progress Notes (Signed)
Subjective:     Patient ID: Frances Adams, female   DOB: 05-09-1976, 42 y.o.   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  Patient screened, and meets BCCCP eligibility.  Patient does not have insurance, Medicare or Medicaid.  Handout given on Affordable Care Act.  Instructed patient on breast self awareness using teach back method.  No mass or lump palpated.  Thickening noted right breast at surgical scar.     Patient had questions about bruising left upper arm at tattoo site, "that wont go away"  States it has been there a couple of months.  Son performed tattooing.  Blue coloration in surrounding tissue.  Encouraged patient to see physician at [facility] where she has been seen before for recommendation. Plan:     Sent for bilateral screening mammogram.

## 2018-03-14 ENCOUNTER — Telehealth: Payer: Self-pay | Admitting: *Deleted

## 2018-03-14 NOTE — Telephone Encounter (Signed)
Patient called to report that she has been having discharge from her Left nipple. She wants to know if she should be seen by Dr. Grayland Ormond.

## 2018-03-14 NOTE — Telephone Encounter (Signed)
She had a normal mammogram in march 2019 and was seen by Webb Silversmith in [place].  I have not seen her in nearly 3 years.  Recommend urgent care first.  Thanks.

## 2018-03-15 NOTE — Telephone Encounter (Signed)
Called patient and left message with Dr. Gary Fleet recommendation that she see urgent care or PCP.

## 2018-04-20 ENCOUNTER — Emergency Department
Admission: EM | Admit: 2018-04-20 | Discharge: 2018-04-20 | Disposition: A | Discharge status: Home / Self Care | Payer: Self-pay | Attending: Student in an Organized Health Care Education/Training Program | Admitting: Student in an Organized Health Care Education/Training Program

## 2018-04-20 ENCOUNTER — Encounter: Payer: Self-pay | Admitting: Emergency Medicine

## 2018-04-20 ENCOUNTER — Other Ambulatory Visit: Payer: Self-pay

## 2018-04-20 DIAGNOSIS — F17210 Nicotine dependence, cigarettes, uncomplicated: Secondary | ICD-10-CM | POA: Insufficient documentation

## 2018-04-20 DIAGNOSIS — N3001 Acute cystitis with hematuria: Secondary | ICD-10-CM | POA: Insufficient documentation

## 2018-04-20 DIAGNOSIS — Z79899 Other long term (current) drug therapy: Secondary | ICD-10-CM | POA: Insufficient documentation

## 2018-04-20 LAB — CBC WITH DIFFERENTIAL/PLATELET
Basophils Absolute: 0.1 10*3/uL (ref 0–0.1)
Basophils Relative: 1 %
EOS ABS: 0.1 10*3/uL (ref 0–0.7)
Eosinophils Relative: 1 %
HCT: 39 % (ref 35.0–47.0)
HEMOGLOBIN: 13.4 g/dL (ref 12.0–16.0)
LYMPHS ABS: 1.9 10*3/uL (ref 1.0–3.6)
LYMPHS PCT: 18 %
MCH: 36.4 pg — AB (ref 26.0–34.0)
MCHC: 34.4 g/dL (ref 32.0–36.0)
MCV: 106 fL — ABNORMAL HIGH (ref 80.0–100.0)
Monocytes Absolute: 0.6 10*3/uL (ref 0.2–0.9)
Monocytes Relative: 5 %
Neutro Abs: 7.7 10*3/uL — ABNORMAL HIGH (ref 1.4–6.5)
Neutrophils Relative %: 75 %
Platelets: 241 10*3/uL (ref 150–440)
RBC: 3.68 MIL/uL — AB (ref 3.80–5.20)
RDW: 16.6 % — ABNORMAL HIGH (ref 11.5–14.5)
WBC: 10.3 10*3/uL (ref 3.6–11.0)

## 2018-04-20 LAB — COMPREHENSIVE METABOLIC PANEL
ALBUMIN: 4.3 g/dL (ref 3.5–5.0)
ALK PHOS: 62 U/L (ref 38–126)
ALT: 14 U/L (ref 0–44)
AST: 21 U/L (ref 15–41)
Anion gap: 8 (ref 5–15)
BUN: 12 mg/dL (ref 6–20)
CALCIUM: 9 mg/dL (ref 8.9–10.3)
CO2: 29 mmol/L (ref 22–32)
Chloride: 106 mmol/L (ref 98–111)
Creatinine, Ser: 0.91 mg/dL (ref 0.44–1.00)
GFR calc non Af Amer: >60 mL/min (ref >60)
Glucose, Bld: 101 mg/dL — ABNORMAL HIGH (ref 70–99)
Potassium: 3.6 mmol/L (ref 3.5–5.1)
SODIUM: 143 mmol/L (ref 135–145)
Total Bilirubin: 0.4 mg/dL (ref 0.3–1.2)
Total Protein: 7 g/dL (ref 6.5–8.1)

## 2018-04-20 LAB — URINALYSIS, COMPLETE (UACMP) WITH MICROSCOPIC
BACTERIA UA: NONE SEEN
Bilirubin Urine: NEGATIVE
Glucose, UA: NEGATIVE mg/dL
Ketones, ur: 5 mg/dL — AB
Nitrite: NEGATIVE
Protein, ur: 30 mg/dL — AB
SPECIFIC GRAVITY, URINE: 1.024 (ref 1.005–1.030)
pH: 7 (ref 5.0–8.0)

## 2018-04-20 MED ORDER — CEPHALEXIN 500 MG PO CAPS
500.0000 mg | ORAL_CAPSULE | Freq: Once | ORAL | Status: AC
  Administered 2018-04-20: 500 mg via ORAL
  Filled 2018-04-20: qty 1

## 2018-04-20 MED ORDER — PHENAZOPYRIDINE HCL 200 MG PO TABS
200.0000 mg | ORAL_TABLET | Freq: Once | ORAL | Status: AC
  Administered 2018-04-20: 200 mg via ORAL
  Filled 2018-04-20 (×2): qty 1

## 2018-04-20 MED ORDER — CEPHALEXIN 500 MG PO CAPS: 500.0000 mg | ORAL_CAPSULE | Freq: Three times a day (TID) | ORAL | 0 refills | Status: AC

## 2018-04-20 NOTE — ED Provider Notes (Signed)
[facility] Emergency Department Provider Note    First MD Initiated Contact with Patient 04/20/18 2004     (approximate)  I have reviewed the triage vital signs and the nursing notes.   HISTORY  Chief Complaint Abdominal Pain    HPI Frances Adams is a 42 y.o. female presents the ER with 1 day of suprapubic discomfort burning with urination and hematuria.  Denies any flank pain nausea or vomiting.  No fevers.  States it feels consistent with previous urinary tract infections.  No recent antibiotics.  Has not taken any Azo or cranberry pills.  Denies any diarrhea or vaginal discharge.    Past Medical History:  Diagnosis Date  . Breast cancer [facility]) 2004   right breast cancer - chemotherapy and radiation  . Breast cancer [facility]) 2009   right breast cancer - radiation  . Cancer (HCC)    RT BREAST  . Personal history of chemotherapy 2004  . Personal history of radiation therapy 2004  . Personal history of radiation therapy 2009   Family History  Problem Relation Age of Onset  . Breast cancer Cousin    Past Surgical History:  Procedure Laterality Date  . ABDOMINAL HYSTERECTOMY    . BREAST BIOPSY Right 2009   Stereotactic biopsy (+)  . BREAST SURGERY     Patient Active Problem List   Diagnosis Date Noted  . History of breast cancer 08/09/2016      Prior to Admission medications   Medication Sig Start Date End Date Taking? Authorizing Provider  cephALEXin (KEFLEX) 500 MG capsule Take 1 capsule (500 mg total) by mouth 3 (three) times daily for 7 days. 04/20/18 04/27/18  Merlyn Lot, MD  chlorpheniramine-HYDROcodone (TUSSIONEX PENNKINETIC ER) 10-8 MG/5ML SUER Take 5 mLs by mouth 2 (two) times daily. 03/10/16   Sable Feil, PA-C  ibuprofen (ADVIL,MOTRIN) 600 MG tablet Take 1 tablet (600 mg total) by mouth every 8 (eight) hours as needed. 01/30/17   Sable Feil, PA-C  naproxen (NAPROSYN) 500 MG tablet Take 1 tablet (500 mg total) by  mouth 2 (two) times daily with a meal. 01/30/17   Sable Feil, PA-C  THYROID PO Take by mouth.    [provider]    Allergies Patient has no known allergies.    Social History Social History   Tobacco Use  . Smoking status: Current Every Day Smoker    Packs/day: 0.50  . Smokeless tobacco: Never Used  Substance Use Topics  . Alcohol use: Yes  . Drug use: No    Review of Systems Patient denies headaches, rhinorrhea, blurry vision, numbness, shortness of breath, chest pain, edema, cough, abdominal pain, nausea, vomiting, diarrhea, dysuria, fevers, rashes or hallucinations unless otherwise stated above in HPI. ____________________________________________   PHYSICAL EXAM:  VITAL SIGNS: Vitals:   04/20/18 1950  BP: 105/61  Pulse: 86  Resp: 18  Temp: 98.6 F (37 C)  SpO2: 99%    Constitutional: Alert and oriented.  Eyes: Conjunctivae are normal.  Head: Atraumatic. Nose: No congestion/rhinnorhea. Mouth/Throat: Mucous membranes are moist.   Neck: No stridor. Painless ROM.  Cardiovascular: Normal rate, regular rhythm. Grossly normal heart sounds.  Good peripheral circulation. Respiratory: Normal respiratory effort.  No retractions. Lungs CTAB. Gastrointestinal: Soft and nontender. No distention. No abdominal bruits. No CVA tenderness. Genitourinary: deferred Musculoskeletal: No lower extremity tenderness nor edema.  No joint effusions. Neurologic:  Normal speech and language. No gross focal neurologic deficits are appreciated. No facial  droop Skin:  Skin is warm, dry and intact. No rash noted. Psychiatric: Mood and affect are normal. Speech and behavior are normal.  ____________________________________________   LABS (all labs ordered are listed, but only abnormal results are displayed)  Results for orders placed or performed during the hospital encounter of 04/20/18 (from the past 24 hour(s))  CBC with Differential     Status: Abnormal   Collection  Time: 04/20/18  7:55 PM  Result Value Ref Range   WBC 10.3 3.6 - 11.0 K/uL   RBC 3.68 (L) 3.80 - 5.20 MIL/uL   Hemoglobin 13.4 12.0 - 16.0 g/dL   HCT 39.0 35.0 - 47.0 %   MCV 106.0 (H) 80.0 - 100.0 fL   MCH 36.4 (H) 26.0 - 34.0 pg   MCHC 34.4 32.0 - 36.0 g/dL   RDW 16.6 (H) 11.5 - 14.5 %   Platelets 241 150 - 440 K/uL   Neutrophils Relative % 75 %   Neutro Abs 7.7 (H) 1.4 - 6.5 K/uL   Lymphocytes Relative 18 %   Lymphs Abs 1.9 1.0 - 3.6 K/uL   Monocytes Relative 5 %   Monocytes Absolute 0.6 0.2 - 0.9 K/uL   Eosinophils Relative 1 %   Eosinophils Absolute 0.1 0 - 0.7 K/uL   Basophils Relative 1 %   Basophils Absolute 0.1 0 - 0.1 K/uL  Comprehensive metabolic panel     Status: Abnormal   Collection Time: 04/20/18  7:55 PM  Result Value Ref Range   Sodium 143 135 - 145 mmol/L   Potassium 3.6 3.5 - 5.1 mmol/L   Chloride 106 98 - 111 mmol/L   CO2 29 22 - 32 mmol/L   Glucose, Bld 101 (H) 70 - 99 mg/dL   BUN 12 6 - 20 mg/dL   Creatinine, Ser 0.91 0.44 - 1.00 mg/dL   Calcium 9.0 8.9 - 10.3 mg/dL   Total Protein 7.0 6.5 - 8.1 g/dL   Albumin 4.3 3.5 - 5.0 g/dL   AST 21 15 - 41 U/L   ALT 14 0 - 44 U/L   Alkaline Phosphatase 62 38 - 126 U/L   Total Bilirubin 0.4 0.3 - 1.2 mg/dL   GFR calc non Af Amer >60 >60 mL/min   GFR calc Af Amer >60 >60 mL/min   Anion gap 8 5 - 15  Urinalysis, Complete w Microscopic     Status: Abnormal   Collection Time: 04/20/18  7:55 PM  Result Value Ref Range   Color, Urine YELLOW (A) YELLOW   APPearance CLEAR (A) CLEAR   Specific Gravity, Urine 1.024 1.005 - 1.030   pH 7.0 5.0 - 8.0   Glucose, UA NEGATIVE NEGATIVE mg/dL   Hgb urine dipstick MODERATE (A) NEGATIVE   Bilirubin Urine NEGATIVE NEGATIVE   Ketones, ur 5 (A) NEGATIVE mg/dL   Protein, ur 30 (A) NEGATIVE mg/dL   Nitrite NEGATIVE NEGATIVE   Leukocytes, UA SMALL (A) NEGATIVE   RBC / HPF 21-50 0 - 5 RBC/hpf   WBC, UA 21-50 0 - 5 WBC/hpf   Bacteria, UA NONE SEEN NONE SEEN   Squamous  Epithelial / LPF 0-5 0 - 5   Mucus PRESENT    ____________________________________________  ____________________________________________  RADIOLOGY   ____________________________________________   PROCEDURES  Procedure(s) performed:  Procedures    Critical Care performed: no ____________________________________________   INITIAL IMPRESSION / ASSESSMENT AND PLAN / ED COURSE  Pertinent labs & imaging results that were available during my care of the patient were  reviewed by me and considered in my medical decision making (see chart for details).   DDX: uti, cystitis, urethral spasm  Alla Sloma Norman Herrlich Adams is a 42 y.o. who presents to the ED with dysuria and discomfort as described above.  She is afebrile Heema dynamically stable.  Her abdominal exam is soft and benign.  Blood work sent for the above differential was reassuring without leukocytosis and she is not febrile.  Not clinically consistent with appendicitis.  No signs or symptoms to suggest PID.  Does have large leukocytes and given her dysuria and discomfort x1 day with hematuria will treat for UTI for acute cystitis.  Not consistent with pyelonephritis.  Patient give dose of medication here in the ER.  Have discussed with the patient and available family all diagnostics and treatments performed thus far and all questions were answered to the best of my ability. The patient demonstrates understanding and agreement with plan.       As part of my medical decision making, I reviewed the following data within the [place] notes reviewed and incorporated, Labs reviewed, notes from prior ED visits.  ____________________________________________   FINAL CLINICAL IMPRESSION(S) / ED DIAGNOSES  Final diagnoses:  Acute cystitis with hematuria      NEW MEDICATIONS STARTED DURING THIS VISIT:  New Prescriptions   CEPHALEXIN (KEFLEX) 500 MG CAPSULE    Take 1 capsule (500 mg total) by mouth 3  (three) times daily for 7 days.     Note:  This document was prepared using Dragon voice recognition software and may include unintentional dictation errors.    Merlyn Lot, MD 04/20/18 2103

## 2018-04-20 NOTE — ED Triage Notes (Signed)
Patient ambulatory to triage with steady gait, without difficulty or distress noted; pt reports lower abd pain with hematuria today

## 2018-07-31 ENCOUNTER — Encounter: Payer: Self-pay | Admitting: Emergency Medicine

## 2018-07-31 ENCOUNTER — Emergency Department: Payer: Self-pay

## 2018-07-31 ENCOUNTER — Emergency Department
Admission: EM | Admit: 2018-07-31 | Discharge: 2018-07-31 | Disposition: A | Discharge status: Home / Self Care | Payer: Self-pay | Attending: Emergency Medicine | Admitting: Emergency Medicine

## 2018-07-31 ENCOUNTER — Other Ambulatory Visit: Payer: Self-pay

## 2018-07-31 DIAGNOSIS — R1030 Lower abdominal pain, unspecified: Secondary | ICD-10-CM | POA: Insufficient documentation

## 2018-07-31 DIAGNOSIS — F17210 Nicotine dependence, cigarettes, uncomplicated: Secondary | ICD-10-CM | POA: Insufficient documentation

## 2018-07-31 DIAGNOSIS — J45909 Unspecified asthma, uncomplicated: Secondary | ICD-10-CM | POA: Insufficient documentation

## 2018-07-31 HISTORY — DX: Unspecified asthma, uncomplicated: J45.909

## 2018-07-31 LAB — CBC
HEMATOCRIT: 37.6 % (ref 36.0–46.0)
HEMOGLOBIN: 12.6 g/dL (ref 12.0–15.0)
MCH: 35.2 pg — ABNORMAL HIGH (ref 26.0–34.0)
MCHC: 33.5 g/dL (ref 30.0–36.0)
MCV: 105 fL — AB (ref 80.0–100.0)
Platelets: 227 10*3/uL (ref 150–400)
RBC: 3.58 MIL/uL — ABNORMAL LOW (ref 3.87–5.11)
RDW: 13.6 % (ref 11.5–15.5)
WBC: 5.1 10*3/uL (ref 4.0–10.5)
nRBC: 0 % (ref 0.0–0.2)

## 2018-07-31 LAB — COMPREHENSIVE METABOLIC PANEL
ALBUMIN: 4.3 g/dL (ref 3.5–5.0)
ALT: 16 U/L (ref 0–44)
AST: 24 U/L (ref 15–41)
Alkaline Phosphatase: 60 U/L (ref 38–126)
Anion gap: 7 (ref 5–15)
BUN: 14 mg/dL (ref 6–20)
CHLORIDE: 108 mmol/L (ref 98–111)
CO2: 28 mmol/L (ref 22–32)
Calcium: 8.8 mg/dL — ABNORMAL LOW (ref 8.9–10.3)
Creatinine, Ser: 0.93 mg/dL (ref 0.44–1.00)
GFR calc Af Amer: >60 mL/min (ref >60)
GFR calc non Af Amer: >60 mL/min (ref >60)
GLUCOSE: 99 mg/dL (ref 70–99)
POTASSIUM: 4.1 mmol/L (ref 3.5–5.1)
SODIUM: 143 mmol/L (ref 135–145)
Total Bilirubin: 0.7 mg/dL (ref 0.3–1.2)
Total Protein: 6.7 g/dL (ref 6.5–8.1)

## 2018-07-31 LAB — URINALYSIS, COMPLETE (UACMP) WITH MICROSCOPIC
BACTERIA UA: NONE SEEN
BILIRUBIN URINE: NEGATIVE
Glucose, UA: NEGATIVE mg/dL
Ketones, ur: NEGATIVE mg/dL
LEUKOCYTES UA: NEGATIVE
Nitrite: NEGATIVE
PROTEIN: NEGATIVE mg/dL
SPECIFIC GRAVITY, URINE: 1.02 (ref 1.005–1.030)
pH: 7 (ref 5.0–8.0)

## 2018-07-31 LAB — LIPASE, BLOOD: LIPASE: 34 U/L (ref 11–51)

## 2018-07-31 MED ORDER — DICYCLOMINE HCL 10 MG PO CAPS: 10.0000 mg | ORAL_CAPSULE | Freq: Three times a day (TID) | ORAL | 0 refills | Status: DC | PRN

## 2018-07-31 MED ORDER — IBUPROFEN 600 MG PO TABS: 600.0000 mg | ORAL_TABLET | Freq: Four times a day (QID) | ORAL | 0 refills | Status: DC | PRN

## 2018-07-31 MED ORDER — IOPAMIDOL (ISOVUE-300) INJECTION 61%
30.0000 mL | Freq: Once | INTRAVENOUS | Status: AC
  Administered 2018-07-31: 30 mL via ORAL

## 2018-07-31 MED ORDER — IOPAMIDOL (ISOVUE-300) INJECTION 61%
100.0000 mL | Freq: Once | INTRAVENOUS | Status: AC | PRN
  Administered 2018-07-31: 100 mL via INTRAVENOUS

## 2018-07-31 NOTE — ED Notes (Signed)
Pt reports abd pain for "serveral weeks". Reports more tender to palpation in RLQ.

## 2018-07-31 NOTE — Discharge Instructions (Addendum)
Return to the ER for new, worsening, persistent severe abdominal pain, fever, vomiting or diarrhea, blood in the stool, urinary symptoms, vaginal bleeding, or any other new or worsening symptoms that concern you.

## 2018-07-31 NOTE — ED Provider Notes (Signed)
[facility] Emergency Department Provider Note ____________________________________________   First MD Initiated Contact with Patient 07/31/18 1519     (approximate)  I have reviewed the triage vital signs and the nursing notes.   HISTORY  Chief Complaint Abdominal Pain    HPI Frances Adams is a 42 y.o. female with PMH as noted below who presents with lower abdominal pain over the last 2 weeks, intermittent course, but worsening over the last several days.  She denies associated vomiting, urinary symptoms, vaginal bleeding or discharge, or fever.  The patient reports chronic diarrhea that has been going on for several months.  She also states that the current symptoms are similar to when she was diagnosed with a bladder infection earlier this year.   Past Medical History:  Diagnosis Date  . Asthma   . Breast cancer [facility]) 2004   right breast cancer - chemotherapy and radiation  . Breast cancer [facility]) 2009   right breast cancer - radiation  . Cancer (HCC)    RT BREAST  . Personal history of chemotherapy 2004  . Personal history of radiation therapy 2004  . Personal history of radiation therapy 2009    Patient Active Problem List   Diagnosis Date Noted  . History of breast cancer 08/09/2016    Past Surgical History:  Procedure Laterality Date  . ABDOMINAL HYSTERECTOMY    . BREAST BIOPSY Right 2009   Stereotactic biopsy (+)  . BREAST SURGERY      Prior to Admission medications   Medication Sig Start Date End Date Taking? Authorizing Provider  chlorpheniramine-HYDROcodone (TUSSIONEX PENNKINETIC ER) 10-8 MG/5ML SUER Take 5 mLs by mouth 2 (two) times daily. 03/10/16   Sable Feil, PA-C  dicyclomine (BENTYL) 10 MG capsule Take 1 capsule (10 mg total) by mouth 3 (three) times daily as needed for up to 5 days for spasms. 07/31/18 08/05/18  Arta Silence, MD  ibuprofen (ADVIL,MOTRIN) 600 MG tablet Take 1 tablet (600 mg total) by mouth  every 6 (six) hours as needed. 07/31/18   Arta Silence, MD  naproxen (NAPROSYN) 500 MG tablet Take 1 tablet (500 mg total) by mouth 2 (two) times daily with a meal. 01/30/17   Sable Feil, PA-C  THYROID PO Take by mouth.    [provider]    Allergies Patient has no known allergies.  Family History  Problem Relation Age of Onset  . Breast cancer Cousin     Social History Social History   Tobacco Use  . Smoking status: Current Every Day Smoker    Packs/day: 0.50  . Smokeless tobacco: Never Used  Substance Use Topics  . Alcohol use: Yes  . Drug use: No    Review of Systems  Constitutional: No fever. Eyes: No redness. ENT: No sore throat. Cardiovascular: Denies chest pain. Respiratory: Denies shortness of breath. Gastrointestinal: Positive for nausea.  Positive for diarrhea..  Genitourinary: Negative for dysuria.  Musculoskeletal: Negative for back pain. Skin: Negative for rash. Neurological: Negative for headache.   ____________________________________________   PHYSICAL EXAM:  VITAL SIGNS: ED Triage Vitals  Enc Vitals Group     BP 07/31/18 1412 122/69     Pulse Rate 07/31/18 1412 79     Resp 07/31/18 1412 18     Temp 07/31/18 1412 98.4 F (36.9 C)     Temp Source 07/31/18 1412 Oral     SpO2 07/31/18 1412 99 %     Weight 07/31/18 1412 120 lb (54.4  kg)     Height 07/31/18 1412 5\' 2"  (1.575 m)     Head Circumference --      Peak Flow --      Pain Score 07/31/18 1415 4     Pain Loc --      Pain Edu? --      Excl. in [place]? --     Constitutional: Alert and oriented. Well appearing and in no acute distress. Eyes: Conjunctivae are normal.  No scleral icterus. Head: Atraumatic. Nose: No congestion/rhinnorhea. Mouth/Throat: Mucous membranes are moist.   Neck: Normal range of motion.  Cardiovascular:  Good peripheral circulation. Respiratory: Normal respiratory effort.  No retractions. Gastrointestinal: Soft with moderate right lower  quadrant tenderness.  No distention.  Genitourinary: No CVA tenderness. Musculoskeletal: No lower extremity edema.  Extremities warm and well perfused.  Neurologic:  Normal speech and language. No gross focal neurologic deficits are appreciated.  Skin:  Skin is warm and dry. No rash noted. Psychiatric: Mood and affect are normal. Speech and behavior are normal.  ____________________________________________   LABS (all labs ordered are listed, but only abnormal results are displayed)  Labs Reviewed  COMPREHENSIVE METABOLIC PANEL - Abnormal; Notable for the following components:      Result Value   Calcium 8.8 (*)    All other components within normal limits  CBC - Abnormal; Notable for the following components:   RBC 3.58 (*)    MCV 105.0 (*)    MCH 35.2 (*)    All other components within normal limits  URINALYSIS, COMPLETE (UACMP) WITH MICROSCOPIC - Abnormal; Notable for the following components:   Color, Urine YELLOW (*)    APPearance CLEAR (*)    Hgb urine dipstick SMALL (*)    All other components within normal limits  LIPASE, BLOOD   ____________________________________________  EKG   ____________________________________________  RADIOLOGY  CT abdomen: No acute abnormalities  ____________________________________________   PROCEDURES  Procedure(s) performed: No  Procedures  Critical Care performed: No ____________________________________________   INITIAL IMPRESSION / ASSESSMENT AND PLAN / ED COURSE  Pertinent labs & imaging results that were available during my care of the patient were reviewed by me and considered in my medical decision making (see chart for details).  42 year old female with PMH as noted above presents with lower abdominal pain over the last 2 weeks, worsening somewhat in the last several days, and associated with diarrhea that has been relatively chronic.  The patient denies vomiting, fever, or GU symptoms.  On exam the patient is  well-appearing and her vital signs are normal.  The abdomen is soft but she does have relatively significant tenderness in the right lower quadrant.  I reviewed the past medical records in Epic; the patient was most recently seen in the ED in August of this past year and was diagnosed with a UTI.  Overall I suspect most likely enteritis or other relatively benign cause given the time course of the symptoms.  The UA shows RBCs but no other significant findings.  However given the tenderness as well as the persistent diarrhea I will obtain a CT to rule out diverticulitis, colitis, or appendicitis.  ----------------------------------------- 7:01 PM on 07/31/2018 -----------------------------------------  The CT is negative for any acute findings.  The patient remains comfortable and has not required any medication while in the ED.  She is stable for discharge home at this time.  Although the etiology of her symptoms is unclear, I suspect benign cause such as enteritis.  Based on the  results of the UA, there is no evidence of acute UTI or indication for antibiotic's.  I counseled the patient on the results of the work-up.  She feels comfortable going home.  Return precautions given, and she expresses understanding. ____________________________________________   FINAL CLINICAL IMPRESSION(S) / ED DIAGNOSES  Final diagnoses:  Lower abdominal pain      NEW MEDICATIONS STARTED DURING THIS VISIT:  New Prescriptions   DICYCLOMINE (BENTYL) 10 MG CAPSULE    Take 1 capsule (10 mg total) by mouth 3 (three) times daily as needed for up to 5 days for spasms.   IBUPROFEN (ADVIL,MOTRIN) 600 MG TABLET    Take 1 tablet (600 mg total) by mouth every 6 (six) hours as needed.     Note:  This document was prepared using Dragon voice recognition software and may include unintentional dictation errors.    Arta Silence, MD 07/31/18 1902

## 2018-07-31 NOTE — ED Triage Notes (Signed)
Here for central lower abdominal pain for 1 week. Denies NVD. VSS. No fevers.  Here 6 months go for same problem but this time it is not as bad.  Denies urinary sx.  Ambulatory. Color WNL.

## 2018-09-14 ENCOUNTER — Other Ambulatory Visit: Payer: Self-pay

## 2018-09-14 ENCOUNTER — Emergency Department
Admission: EM | Admit: 2018-09-14 | Discharge: 2018-09-14 | Disposition: A | Discharge status: Home / Self Care | Payer: Self-pay | Attending: Emergency Medicine | Admitting: Emergency Medicine

## 2018-09-14 DIAGNOSIS — J069 Acute upper respiratory infection, unspecified: Secondary | ICD-10-CM | POA: Insufficient documentation

## 2018-09-14 DIAGNOSIS — Z79899 Other long term (current) drug therapy: Secondary | ICD-10-CM | POA: Insufficient documentation

## 2018-09-14 DIAGNOSIS — Z853 Personal history of malignant neoplasm of breast: Secondary | ICD-10-CM | POA: Insufficient documentation

## 2018-09-14 DIAGNOSIS — B349 Viral infection, unspecified: Secondary | ICD-10-CM

## 2018-09-14 DIAGNOSIS — J45909 Unspecified asthma, uncomplicated: Secondary | ICD-10-CM | POA: Insufficient documentation

## 2018-09-14 DIAGNOSIS — F17200 Nicotine dependence, unspecified, uncomplicated: Secondary | ICD-10-CM | POA: Insufficient documentation

## 2018-09-14 LAB — URINALYSIS, COMPLETE (UACMP) WITH MICROSCOPIC
Bacteria, UA: NONE SEEN
Bilirubin Urine: NEGATIVE
Glucose, UA: NEGATIVE mg/dL
KETONES UR: NEGATIVE mg/dL
Leukocytes, UA: NEGATIVE
Nitrite: NEGATIVE
PH: 5 (ref 5.0–8.0)
Protein, ur: NEGATIVE mg/dL
SPECIFIC GRAVITY, URINE: 1.024 (ref 1.005–1.030)

## 2018-09-14 LAB — CBC
HEMATOCRIT: 38.7 % (ref 36.0–46.0)
HEMOGLOBIN: 12.8 g/dL (ref 12.0–15.0)
MCH: 35.7 pg — ABNORMAL HIGH (ref 26.0–34.0)
MCHC: 33.1 g/dL (ref 30.0–36.0)
MCV: 107.8 fL — ABNORMAL HIGH (ref 80.0–100.0)
NRBC: 0 % (ref 0.0–0.2)
Platelets: 245 10*3/uL (ref 150–400)
RBC: 3.59 MIL/uL — ABNORMAL LOW (ref 3.87–5.11)
RDW: 13.3 % (ref 11.5–15.5)
WBC: 8 10*3/uL (ref 4.0–10.5)

## 2018-09-14 LAB — COMPREHENSIVE METABOLIC PANEL
ALBUMIN: 4.4 g/dL (ref 3.5–5.0)
ALK PHOS: 63 U/L (ref 38–126)
ALT: 16 U/L (ref 0–44)
AST: 22 U/L (ref 15–41)
Anion gap: 8 (ref 5–15)
BILIRUBIN TOTAL: 0.4 mg/dL (ref 0.3–1.2)
BUN: 14 mg/dL (ref 6–20)
CALCIUM: 9.1 mg/dL (ref 8.9–10.3)
CO2: 27 mmol/L (ref 22–32)
Chloride: 106 mmol/L (ref 98–111)
Creatinine, Ser: 0.76 mg/dL (ref 0.44–1.00)
GFR calc Af Amer: >60 mL/min (ref >60)
GFR calc non Af Amer: >60 mL/min (ref >60)
GLUCOSE: 106 mg/dL — AB (ref 70–99)
Potassium: 3.1 mmol/L — ABNORMAL LOW (ref 3.5–5.1)
Sodium: 141 mmol/L (ref 135–145)
TOTAL PROTEIN: 7.1 g/dL (ref 6.5–8.1)

## 2018-09-14 LAB — INFLUENZA PANEL BY PCR (TYPE A & B)
INFLBPCR: NEGATIVE
Influenza A By PCR: NEGATIVE

## 2018-09-14 NOTE — ED Provider Notes (Signed)
[facility] Emergency Department Provider Note  Time seen: 9:31 PM  I have reviewed the triage vital signs and the nursing notes.   HISTORY  Chief Complaint Fever    HPI Frances Adams is a 43 y.o. female with a past medical history of breast cancer presents to the emergency department for subjective fever cough, nausea and abdominal discomfort.  According to the patient she cleaned her house with bleach approximately 3 days ago, since then she has had a mild cough.  States last night she has developed subjective fever and intermittent abdominal discomfort.  Denies any urinary frequency or dysuria but states she noticed a discoloration of her urine.  No vaginal bleeding or discharge.  Has not measured a temperature.   Past Medical History:  Diagnosis Date  . Asthma   . Breast cancer [facility]) 2004   right breast cancer - chemotherapy and radiation  . Breast cancer [facility]) 2009   right breast cancer - radiation  . Cancer (HCC)    RT BREAST  . Personal history of chemotherapy 2004  . Personal history of radiation therapy 2004  . Personal history of radiation therapy 2009    Patient Active Problem List   Diagnosis Date Noted  . History of breast cancer 08/09/2016    Past Surgical History:  Procedure Laterality Date  . ABDOMINAL HYSTERECTOMY    . BREAST BIOPSY Right 2009   Stereotactic biopsy (+)  . BREAST SURGERY      Prior to Admission medications   Medication Sig Start Date End Date Taking? Authorizing Provider  chlorpheniramine-HYDROcodone (TUSSIONEX PENNKINETIC ER) 10-8 MG/5ML SUER Take 5 mLs by mouth 2 (two) times daily. 03/10/16   Sable Feil, PA-C  dicyclomine (BENTYL) 10 MG capsule Take 1 capsule (10 mg total) by mouth 3 (three) times daily as needed for up to 5 days for spasms. 07/31/18 08/05/18  Arta Silence, MD  ibuprofen (ADVIL,MOTRIN) 600 MG tablet Take 1 tablet (600 mg total) by mouth every 6 (six) hours as needed. 07/31/18    Arta Silence, MD  naproxen (NAPROSYN) 500 MG tablet Take 1 tablet (500 mg total) by mouth 2 (two) times daily with a meal. 01/30/17   Sable Feil, PA-C  THYROID PO Take by mouth.    [provider]    No Known Allergies  Family History  Problem Relation Age of Onset  . Breast cancer Cousin     Social History Social History   Tobacco Use  . Smoking status: Current Every Day Smoker    Packs/day: 0.50  . Smokeless tobacco: Never Used  Substance Use Topics  . Alcohol use: Yes  . Drug use: No    Review of Systems Constitutional: Subjective fever. ENT: Negative for recent illness/congestion Cardiovascular: Negative for chest pain. Respiratory: Negative for shortness of breath.  Mild cough. Gastrointestinal: Mild mid abdominal discomfort, currently resolved.  Nausea. Genitourinary: States some discoloration to her urine but denies any dysuria or frequency. Musculoskeletal: Negative for musculoskeletal complaints Skin: Negative for skin complaints  Neurological: Negative for headache All other ROS negative  ____________________________________________   PHYSICAL EXAM:  VITAL SIGNS: ED Triage Vitals  Enc Vitals Group     BP 09/14/18 1946 (!) 105/54     Pulse Rate 09/14/18 1946 88     Resp 09/14/18 1946 20     Temp 09/14/18 1946 97.9 F (36.6 C)     Temp Source 09/14/18 1946 Oral     SpO2 09/14/18 1946 100 %  Weight 09/14/18 1947 120 lb (54.4 kg)     Height --      Head Circumference --      Peak Flow --      Pain Score 09/14/18 1946 5     Pain Loc --      Pain Edu? --      Excl. in [place]? --    Constitutional: Alert and oriented. Well appearing and in no distress. Eyes: Normal exam ENT   Head: Normocephalic and atraumatic.   Mouth/Throat: Mucous membranes are moist. Cardiovascular: Normal rate, regular rhythm. No murmur Respiratory: Normal respiratory effort without tachypnea nor retractions. Breath sounds are clear  Gastrointestinal:  Soft and nontender. No distention. Musculoskeletal: Nontender with normal range of motion in all extremities.  Neurologic:  Normal speech and language. No gross focal neurologic deficits Skin:  Skin is warm, dry and intact.  Psychiatric: Mood and affect are normal.   ____________________________________________   INITIAL IMPRESSION / ASSESSMENT AND PLAN / ED COURSE  Pertinent labs & imaging results that were available during my care of the patient were reviewed by me and considered in my medical decision making (see chart for details).  Patient presents to the emergency department for mild cough, subjective fever and nausea.  Differential would include upper respiratory infection, metabolic abnormality, gastroenteritis, viral syndrome.  Overall the patient appears very well with a reassuring physical exam.  Nontender abdomen.  Patient's work-up is largely nonrevealing she does have a few red blood cells in her urine we will send a urine culture.  Her description of the pain does not appear consistent with ureterolithiasis.  We will discharge patient home with PCP follow-up.  ____________________________________________   FINAL CLINICAL IMPRESSION(S) / ED DIAGNOSES  Upper respiratory infection   Harvest Dark, MD 09/14/18 2135

## 2018-09-14 NOTE — Discharge Instructions (Addendum)
Your work-up in the emergency department today showed overall normal results.  Please use ibuprofen or Tylenol as needed for any fever or discomfort.  Please drink plenty of fluids and obtain plenty of rest.  Return to the emergency department for any symptoms personally concerning to yourself, otherwise please follow-up with your doctor in 2 to 3 days for recheck/reevaluation.

## 2018-09-14 NOTE — ED Triage Notes (Signed)
Pt in with co fever and coughing that started last night. States vomited x 2, also co mid abd pain and orange colored urine. Pt denies any frequency or burning on urination.

## 2018-09-28 ENCOUNTER — Emergency Department: Payer: Self-pay

## 2018-09-28 ENCOUNTER — Other Ambulatory Visit: Payer: Self-pay

## 2018-09-28 ENCOUNTER — Emergency Department
Admission: EM | Admit: 2018-09-28 | Discharge: 2018-09-28 | Disposition: A | Discharge status: Home / Self Care | Payer: Self-pay | Attending: Emergency Medicine | Admitting: Emergency Medicine

## 2018-09-28 DIAGNOSIS — F17200 Nicotine dependence, unspecified, uncomplicated: Secondary | ICD-10-CM | POA: Insufficient documentation

## 2018-09-28 DIAGNOSIS — Z853 Personal history of malignant neoplasm of breast: Secondary | ICD-10-CM | POA: Insufficient documentation

## 2018-09-28 DIAGNOSIS — R0789 Other chest pain: Secondary | ICD-10-CM

## 2018-09-28 DIAGNOSIS — N644 Mastodynia: Secondary | ICD-10-CM | POA: Insufficient documentation

## 2018-09-28 LAB — TROPONIN I: Troponin I: <0.03 ng/mL (ref <0.03)

## 2018-09-28 LAB — BASIC METABOLIC PANEL
ANION GAP: 7 (ref 5–15)
BUN: 10 mg/dL (ref 6–20)
CO2: 25 mmol/L (ref 22–32)
Calcium: 8.8 mg/dL — ABNORMAL LOW (ref 8.9–10.3)
Chloride: 107 mmol/L (ref 98–111)
Creatinine, Ser: 0.67 mg/dL (ref 0.44–1.00)
GFR calc non Af Amer: >60 mL/min (ref >60)
GLUCOSE: 106 mg/dL — AB (ref 70–99)
POTASSIUM: 3 mmol/L — AB (ref 3.5–5.1)
SODIUM: 139 mmol/L (ref 135–145)

## 2018-09-28 LAB — CBC
HEMATOCRIT: 39 % (ref 36.0–46.0)
HEMOGLOBIN: 12.8 g/dL (ref 12.0–15.0)
MCH: 34 pg (ref 26.0–34.0)
MCHC: 32.8 g/dL (ref 30.0–36.0)
MCV: 103.4 fL — AB (ref 80.0–100.0)
Platelets: 196 10*3/uL (ref 150–400)
RBC: 3.77 MIL/uL — ABNORMAL LOW (ref 3.87–5.11)
RDW: 12.9 % (ref 11.5–15.5)
WBC: 6.9 10*3/uL (ref 4.0–10.5)
nRBC: 0 % (ref 0.0–0.2)

## 2018-09-28 MED ORDER — IBUPROFEN 600 MG PO TABS
600.0000 mg | ORAL_TABLET | Freq: Once | ORAL | Status: AC
  Administered 2018-09-28: 600 mg via ORAL
  Filled 2018-09-28: qty 1

## 2018-09-28 MED ORDER — NAPROXEN 250 MG PO TABS: 250.0000 mg | ORAL_TABLET | Freq: Two times a day (BID) | ORAL | 0 refills | Status: DC

## 2018-09-28 NOTE — ED Notes (Signed)
Pt given sandwich tray 

## 2018-09-28 NOTE — ED Triage Notes (Signed)
Pt arrives with complaints of left sided chest pain that started 2 hours prior to arrival to ED. Pt reports the pain feels tight & sharp and makes her short of breath and dizzy. Pt also reports the pain is intermittent. Pt states similar episodes in the past.

## 2018-09-28 NOTE — ED Provider Notes (Addendum)
[facility] Emergency Department Provider Note  ____________________________________________   I have reviewed the triage vital signs and the nursing notes. Where available I have reviewed prior notes and, if possible and indicated, outside hospital notes.    HISTORY  Chief Complaint Chest Pain    HPI Frances Adams is a 42 y.o. female who presents today complaining of pain in her right breast.  Patient has chronic recurrent pain in this area from prior cancer.  She has not had a mastectomy, she does have scar tissue from prior lumpectomies.  She states that she changed position awkwardly this morning and started having pain in that right chest wall.  It is in the breast itself.  There is no shortness of breath there is no exertional symptoms there is no positional symptoms in terms of lying flat or sitting up but if she changes position the wrong way in terms of twisting or if she touches her chest wall it is uncomfortable.  She has had this before, but usually is a little higher this pain is slightly lower than she normally gets in the scar tissue.  She states that she does not wish any narcotic pain medication because she is driving.  No leg swelling no personal family history of PE or DVT, no pleuritic pain no shortness of breath    Past Medical History:  Diagnosis Date  . Asthma   . Breast cancer [facility]) 2004   right breast cancer - chemotherapy and radiation  . Breast cancer [facility]) 2009   right breast cancer - radiation  . Cancer (HCC)    RT BREAST  . Personal history of chemotherapy 2004  . Personal history of radiation therapy 2004  . Personal history of radiation therapy 2009    Patient Active Problem List   Diagnosis Date Noted  . History of breast cancer 08/09/2016    Past Surgical History:  Procedure Laterality Date  . ABDOMINAL HYSTERECTOMY    . BREAST BIOPSY Right 2009   Stereotactic biopsy (+)  . BREAST SURGERY      Prior to  Admission medications   Medication Sig Start Date End Date Taking? Authorizing Provider  acetaminophen (TYLENOL) 500 MG tablet Take 500 mg by mouth every 6 (six) hours as needed.   Yes [provider]  dicyclomine (BENTYL) 10 MG capsule Take 1 capsule (10 mg total) by mouth 3 (three) times daily as needed for up to 5 days for spasms. 07/31/18 08/05/18  Arta Silence, MD    Allergies Patient has no known allergies.  Family History  Problem Relation Age of Onset  . Breast cancer Cousin     Social History Social History   Tobacco Use  . Smoking status: Current Every Day Smoker    Packs/day: 0.50  . Smokeless tobacco: Never Used  Substance Use Topics  . Alcohol use: Yes  . Drug use: No    Review of Systems Constitutional: No fever/chills Eyes: No visual changes. ENT: No sore throat. No stiff neck no neck pain Cardiovascular: Denies chest pain. Respiratory: Denies shortness of breath. Gastrointestinal:   no vomiting.  No diarrhea.  No constipation. Genitourinary: Negative for dysuria. Musculoskeletal: Negative lower extremity swelling Skin: Negative for rash. Neurological: Negative for severe headaches, focal weakness or numbness.   ____________________________________________   PHYSICAL EXAM:  VITAL SIGNS: ED Triage Vitals  Enc Vitals Group     BP 09/28/18 1502 116/69     Pulse Rate 09/28/18 1502 85  Resp 09/28/18 1502 18     Temp 09/28/18 1502 97.8 F (36.6 C)     Temp Source 09/28/18 1502 Oral     SpO2 09/28/18 1502 97 %     Weight 09/28/18 1500 120 lb (54.4 kg)     Height 09/28/18 1500 5\' 2"  (1.575 m)     Head Circumference --      Peak Flow --      Pain Score 09/28/18 1500 7     Pain Loc --      Pain Edu? --      Excl. in [place]? --     Constitutional: Alert and oriented. Well appearing and in no acute distress. Eyes: Conjunctivae are normal Head: Atraumatic HEENT: No congestion/rhinnorhea. Mucous membranes are moist.  Oropharynx  non-erythematous Neck:   Nontender with no meningismus, no masses, no stridor Cardiovascular: Normal rate, regular rhythm. Grossly normal heart sounds.  Good peripheral circulation. Chest: Female chaperone present, patient tender palpation in the right breast, when I touch that area she states "ouch that the pain right there, there is some induration which she describes as scar tissue it is not fluctuant, there is however a mass noted there, unclear how long it is been there.  Patient feels that it this is her normal breast however with new tenderness.  There is no erythema, there is no redness there is no streaking from the area, the breast is otherwise normal in appearance. Respiratory: Normal respiratory effort.  No retractions. Lungs CTAB. Abdominal: Soft and nontender. No distention. No guarding no rebound Back:  There is no focal tenderness or step off.  there is no midline tenderness there are no lesions noted. there is no CVA tenderness Musculoskeletal: No lower extremity tenderness, no upper extremity tenderness. No joint effusions, no DVT signs strong distal pulses no edema Neurologic:  Normal speech and language. No gross focal neurologic deficits are appreciated.  Skin:  Skin is warm, dry and intact. No rash noted. Psychiatric: Mood and affect are normal. Speech and behavior are normal.  ____________________________________________   LABS (all labs ordered are listed, but only abnormal results are displayed)  Labs Reviewed  BASIC METABOLIC PANEL - Abnormal; Notable for the following components:      Result Value   Potassium 3.0 (*)    Glucose, Bld 106 (*)    Calcium 8.8 (*)    All other components within normal limits  CBC - Abnormal; Notable for the following components:   RBC 3.77 (*)    MCV 103.4 (*)    All other components within normal limits  TROPONIN I  POC URINE PREG, ED    Pertinent labs  results that were available during my care of the patient were reviewed by  me and considered in my medical decision making (see chart for details). ____________________________________________  EKG  I personally interpreted any EKGs ordered by me or triage Sinus rate 83 no acute ST elevation or depression normal axis unremarkable EKG ____________________________________________  RADIOLOGY  Pertinent labs & imaging results that were available during my care of the patient were reviewed by me and considered in my medical decision making (see chart for details). If possible, patient and/or family made aware of any abnormal findings.  Dg Chest 2 View  Result Date: 09/28/2018 CLINICAL DATA:  Right-sided chest pain for 1 day with productive cough EXAM: CHEST - 2 VIEW COMPARISON:  03/10/2016 FINDINGS: Cardiac shadows within normal limits. The lungs are well aerated bilaterally. No focal infiltrate or sizable  effusion is seen. No acute bony abnormality is noted. IMPRESSION: No acute abnormality seen. Electronically Signed   By: Inez Catalina M.D.   On: 09/28/2018 15:47   ____________________________________________    PROCEDURES  Procedure(s) performed: None  Procedures  Critical Care performed: None  ____________________________________________   INITIAL IMPRESSION / ASSESSMENT AND PLAN / ED COURSE  Pertinent labs & imaging results that were available during my care of the patient were reviewed by me and considered in my medical decision making (see chart for details).  With very reproducible tightening in her breast.  She does have chronic pain in that breast from scar tissue in the past this certainly could be simply scar tissue nothing at this time to indicate that there is an abscess or infection or inflammation I do not think this represents ACS PE dissection myocarditis endocarditis pneumonia pneumothorax or other intrathoracic pathology.  The breast itself is healthy in appearance.  This certainly could be a recurrence of her oncologic pathologies as  well.  It is tender to touch, which is not usually the case with breast cancer however.  We will obtain ultrasound to further characterize this lesion and we will see about trying to get her pain control as an outpatient and close follow-up.  ----------------------------------------- 8:19 PM on 09/28/2018 -----------------------------------------  Call from radiology, there is a structure in there that could be scar tissue could be mass, unclear, he recommended outpatient follow-up.  I called Dr. Janese Banks, of oncology, she advises close outpatient follow-up and will follow-up.  Patient in no acute distress resting comfortably eager to go home.  At this time, there does not appear to be clinical evidence to support the diagnosis of pulmonary embolus, dissection, myocarditis, endocarditis, pericarditis, pericardial tamponade, acute coronary syndrome, pneumothorax, pneumonia, or any other acute intrathoracic pathology that will require admission or acute intervention. Nor is there evidence of any significant intra-abdominal pathology causing this discomfort.    ____________________________________________   FINAL CLINICAL IMPRESSION(S) / ED DIAGNOSES  Final diagnoses:  Pain in breast      This chart was dictated using voice recognition software.  Despite best efforts to proofread,  errors can occur which can change meaning.      Schuyler Amor, MD 09/28/18 1744    Schuyler Amor, MD 09/28/18 2022

## 2018-09-28 NOTE — ED Notes (Signed)
Patient transported to Ultrasound 

## 2018-09-28 NOTE — Discharge Instructions (Signed)
The ultrasound shows something in your breast that could be cancer, but could just be scar tissue from prior cancer.  We are not sure.  For this reason, we asked that you follow closely with your oncologist or [place] or 1 of his partners.  We did talk to Dr. Janese Banks, they should be expecting a call but if they are not let them know that you need to be seen right away when you call tomorrow.  If you have increased pain, chest pain of any other kind, shortness of breath or any other concerns return to the emergency department.

## 2018-09-29 ENCOUNTER — Other Ambulatory Visit: Payer: Self-pay

## 2018-09-29 ENCOUNTER — Other Ambulatory Visit: Payer: Self-pay | Admitting: *Deleted

## 2018-09-29 DIAGNOSIS — N63 Unspecified lump in unspecified breast: Secondary | ICD-10-CM

## 2018-09-29 NOTE — Progress Notes (Signed)
Phoned patient with appointment for right breast mammogram, and ultrasound on 10/04/2018 at 10:40.  Patient still eligible for BCCCP until 11/09/2018.  Will follow per BCCCP guidelines.

## 2018-09-30 NOTE — Progress Notes (Signed)
[place] [facility] patient has new found right breast lump on ultrasound.  Radiologist recommended right breast diagnostic mammogram, and ultrasound.  BCCCP eligibility until 11/09/2018 when patient will be due for annual mammogram.  Worked with Roselyn Reef, and Lenna Sciara at [facility].  Patient is scheduled for bilateral diagnostic mammogram, and bilateral breast ultrasound on 10/04/2018 at 10:40.  Patient aware of appointment.

## 2018-10-03 ENCOUNTER — Emergency Department: Payer: No Typology Code available for payment source

## 2018-10-03 ENCOUNTER — Emergency Department
Admission: EM | Admit: 2018-10-03 | Discharge: 2018-10-03 | Disposition: A | Discharge status: Home / Self Care | Payer: No Typology Code available for payment source | Attending: Emergency Medicine | Admitting: Emergency Medicine

## 2018-10-03 ENCOUNTER — Encounter: Payer: Self-pay | Admitting: Emergency Medicine

## 2018-10-03 ENCOUNTER — Other Ambulatory Visit: Payer: Self-pay

## 2018-10-03 DIAGNOSIS — Z853 Personal history of malignant neoplasm of breast: Secondary | ICD-10-CM | POA: Diagnosis not present

## 2018-10-03 DIAGNOSIS — Y9389 Activity, other specified: Secondary | ICD-10-CM | POA: Insufficient documentation

## 2018-10-03 DIAGNOSIS — M549 Dorsalgia, unspecified: Secondary | ICD-10-CM | POA: Diagnosis not present

## 2018-10-03 DIAGNOSIS — M542 Cervicalgia: Secondary | ICD-10-CM | POA: Insufficient documentation

## 2018-10-03 DIAGNOSIS — F17210 Nicotine dependence, cigarettes, uncomplicated: Secondary | ICD-10-CM | POA: Insufficient documentation

## 2018-10-03 DIAGNOSIS — Y92410 Unspecified street and highway as the place of occurrence of the external cause: Secondary | ICD-10-CM | POA: Insufficient documentation

## 2018-10-03 DIAGNOSIS — Z923 Personal history of irradiation: Secondary | ICD-10-CM | POA: Insufficient documentation

## 2018-10-03 DIAGNOSIS — Z9221 Personal history of antineoplastic chemotherapy: Secondary | ICD-10-CM | POA: Insufficient documentation

## 2018-10-03 DIAGNOSIS — G44209 Tension-type headache, unspecified, not intractable: Secondary | ICD-10-CM | POA: Insufficient documentation

## 2018-10-03 DIAGNOSIS — Y998 Other external cause status: Secondary | ICD-10-CM | POA: Insufficient documentation

## 2018-10-03 DIAGNOSIS — R51 Headache: Secondary | ICD-10-CM | POA: Diagnosis present

## 2018-10-03 MED ORDER — BACLOFEN 10 MG PO TABS: 10.0000 mg | ORAL_TABLET | Freq: Three times a day (TID) | ORAL | 1 refills | Status: DC

## 2018-10-03 MED ORDER — MELOXICAM 15 MG PO TABS: 15.0000 mg | ORAL_TABLET | Freq: Every day | ORAL | 2 refills | Status: DC

## 2018-10-03 NOTE — ED Notes (Signed)
Pt continues to c/o headache that is "aggravating". Pt sitting on bed w/ legs crossed at this time. Pt denies change in bowel or bladder habits. Pt states she is intermittently taking tylenol for pain, last taken more than 18 hrs ago.
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
## 2018-10-03 NOTE — Discharge Instructions (Addendum)
Follow-up with your regular doctor if not better in 3 days.  Take the medication as prescribed.  Drink plenty of fluids.  You may also take Tylenol for pain as needed.  Return if worsening

## 2018-10-04 ENCOUNTER — Ambulatory Visit
Admission: RE | Admit: 2018-10-04 | Discharge: 2018-10-04 | Disposition: A | Discharge status: Home / Self Care | Payer: Self-pay | Source: Ambulatory Visit | Attending: Oncology | Admitting: Oncology

## 2018-10-04 DIAGNOSIS — N63 Unspecified lump in unspecified breast: Secondary | ICD-10-CM

## 2018-10-05 NOTE — Progress Notes (Signed)
Thank you. I called her to remind her to schedule BCCCP appointment in one year.

## 2018-10-05 NOTE — Progress Notes (Signed)
Phoned patient with [facility] 2 bilateral diagnostic mammogram results.  Explained she will need to make annual BCCCP appointment in one year, or earlier if needed.  Per Dr. Grayland Ormond, no need for cancer Center appointment at this time.  Copy to [facility].

## 2019-05-13 ENCOUNTER — Ambulatory Visit (HOSPITAL_COMMUNITY)
Admission: EM | Admit: 2019-05-13 | Discharge: 2019-05-13 | Disposition: A | Discharge status: Home / Self Care | Payer: Self-pay | Attending: General Surgery | Admitting: General Surgery

## 2019-05-13 ENCOUNTER — Encounter: Payer: Self-pay | Admitting: Emergency Medicine

## 2019-05-13 ENCOUNTER — Emergency Department
Admission: EM | Admit: 2019-05-13 | Discharge: 2019-05-13 | Disposition: A | Discharge status: Home / Self Care | Payer: Self-pay | Attending: Emergency Medicine | Admitting: Emergency Medicine

## 2019-05-13 ENCOUNTER — Encounter (HOSPITAL_COMMUNITY): Payer: Self-pay | Admitting: Anesthesiology

## 2019-05-13 ENCOUNTER — Emergency Department (HOSPITAL_COMMUNITY): Payer: Self-pay | Admitting: Certified Registered Nurse Anesthetist

## 2019-05-13 ENCOUNTER — Other Ambulatory Visit: Payer: Self-pay

## 2019-05-13 ENCOUNTER — Encounter: Payer: Self-pay | Admitting: Intensive Care

## 2019-05-13 ENCOUNTER — Emergency Department: Payer: Self-pay

## 2019-05-13 ENCOUNTER — Emergency Department
Admission: EM | Admit: 2019-05-13 | Discharge: 2019-05-13 | Disposition: A | Discharge status: Other Acute Inpatient Hospital | Payer: Self-pay | Attending: Emergency Medicine | Admitting: Emergency Medicine

## 2019-05-13 ENCOUNTER — Encounter (HOSPITAL_COMMUNITY)
Admission: EM | Disposition: A | Discharge status: Home / Self Care | Payer: Self-pay | Source: Home / Self Care | Attending: Emergency Medicine

## 2019-05-13 DIAGNOSIS — Z23 Encounter for immunization: Secondary | ICD-10-CM | POA: Insufficient documentation

## 2019-05-13 DIAGNOSIS — Z791 Long term (current) use of non-steroidal anti-inflammatories (NSAID): Secondary | ICD-10-CM | POA: Insufficient documentation

## 2019-05-13 DIAGNOSIS — J45909 Unspecified asthma, uncomplicated: Secondary | ICD-10-CM | POA: Insufficient documentation

## 2019-05-13 DIAGNOSIS — Z20828 Contact with and (suspected) exposure to other viral communicable diseases: Secondary | ICD-10-CM | POA: Insufficient documentation

## 2019-05-13 DIAGNOSIS — Z79899 Other long term (current) drug therapy: Secondary | ICD-10-CM | POA: Insufficient documentation

## 2019-05-13 DIAGNOSIS — Z803 Family history of malignant neoplasm of breast: Secondary | ICD-10-CM | POA: Insufficient documentation

## 2019-05-13 DIAGNOSIS — F17210 Nicotine dependence, cigarettes, uncomplicated: Secondary | ICD-10-CM | POA: Insufficient documentation

## 2019-05-13 DIAGNOSIS — S62612B Displaced fracture of proximal phalanx of right middle finger, initial encounter for open fracture: Secondary | ICD-10-CM | POA: Insufficient documentation

## 2019-05-13 DIAGNOSIS — Z853 Personal history of malignant neoplasm of breast: Secondary | ICD-10-CM | POA: Insufficient documentation

## 2019-05-13 DIAGNOSIS — Z9221 Personal history of antineoplastic chemotherapy: Secondary | ICD-10-CM | POA: Insufficient documentation

## 2019-05-13 DIAGNOSIS — Y939 Activity, unspecified: Secondary | ICD-10-CM | POA: Insufficient documentation

## 2019-05-13 DIAGNOSIS — S41112A Laceration without foreign body of left upper arm, initial encounter: Secondary | ICD-10-CM

## 2019-05-13 DIAGNOSIS — Z923 Personal history of irradiation: Secondary | ICD-10-CM | POA: Insufficient documentation

## 2019-05-13 DIAGNOSIS — Y999 Unspecified external cause status: Secondary | ICD-10-CM | POA: Insufficient documentation

## 2019-05-13 DIAGNOSIS — F17200 Nicotine dependence, unspecified, uncomplicated: Secondary | ICD-10-CM | POA: Insufficient documentation

## 2019-05-13 DIAGNOSIS — Y69 Unspecified misadventure during surgical and medical care: Secondary | ICD-10-CM | POA: Insufficient documentation

## 2019-05-13 DIAGNOSIS — Z5189 Encounter for other specified aftercare: Secondary | ICD-10-CM | POA: Insufficient documentation

## 2019-05-13 DIAGNOSIS — Y929 Unspecified place or not applicable: Secondary | ICD-10-CM | POA: Insufficient documentation

## 2019-05-13 DIAGNOSIS — W540XXA Bitten by dog, initial encounter: Secondary | ICD-10-CM | POA: Insufficient documentation

## 2019-05-13 DIAGNOSIS — Z90710 Acquired absence of both cervix and uterus: Secondary | ICD-10-CM | POA: Insufficient documentation

## 2019-05-13 DIAGNOSIS — T819XXA Unspecified complication of procedure, initial encounter: Secondary | ICD-10-CM | POA: Insufficient documentation

## 2019-05-13 HISTORY — PX: PERCUTANEOUS PINNING: SHX2209

## 2019-05-13 HISTORY — PX: I&D EXTREMITY: SHX5045

## 2019-05-13 LAB — BASIC METABOLIC PANEL
Anion gap: 11 (ref 5–15)
BUN: 6 mg/dL (ref 6–20)
CO2: 25 mmol/L (ref 22–32)
Calcium: 8.8 mg/dL — ABNORMAL LOW (ref 8.9–10.3)
Chloride: 109 mmol/L (ref 98–111)
Creatinine, Ser: 0.84 mg/dL (ref 0.44–1.00)
GFR calc Af Amer: >60 mL/min (ref >60)
GFR calc non Af Amer: >60 mL/min (ref >60)
Glucose, Bld: 107 mg/dL — ABNORMAL HIGH (ref 70–99)
Potassium: 3.4 mmol/L — ABNORMAL LOW (ref 3.5–5.1)
Sodium: 145 mmol/L (ref 135–145)

## 2019-05-13 LAB — CBC WITH DIFFERENTIAL/PLATELET
Abs Immature Granulocytes: 0.02 10*3/uL (ref 0.00–0.07)
Basophils Absolute: 0.1 10*3/uL (ref 0.0–0.1)
Basophils Relative: 1 %
Eosinophils Absolute: 0.2 10*3/uL (ref 0.0–0.5)
Eosinophils Relative: 4 %
HCT: 39.5 % (ref 36.0–46.0)
Hemoglobin: 13 g/dL (ref 12.0–15.0)
Immature Granulocytes: 0 %
Lymphocytes Relative: 44 %
Lymphs Abs: 2.5 10*3/uL (ref 0.7–4.0)
MCH: 33.9 pg (ref 26.0–34.0)
MCHC: 32.9 g/dL (ref 30.0–36.0)
MCV: 103.1 fL — ABNORMAL HIGH (ref 80.0–100.0)
Monocytes Absolute: 0.4 10*3/uL (ref 0.1–1.0)
Monocytes Relative: 6 %
Neutro Abs: 2.7 10*3/uL (ref 1.7–7.7)
Neutrophils Relative %: 45 %
Platelets: 266 10*3/uL (ref 150–400)
RBC: 3.83 MIL/uL — ABNORMAL LOW (ref 3.87–5.11)
RDW: 13.3 % (ref 11.5–15.5)
WBC: 5.8 10*3/uL (ref 4.0–10.5)
nRBC: 0 % (ref 0.0–0.2)

## 2019-05-13 LAB — SARS CORONAVIRUS 2 BY RT PCR (HOSPITAL ORDER, PERFORMED IN ~~LOC~~ HOSPITAL LAB): SARS Coronavirus 2: NEGATIVE

## 2019-05-13 SURGERY — IRRIGATION AND DEBRIDEMENT EXTREMITY
Anesthesia: General | Site: Hand | Laterality: Right

## 2019-05-13 MED ORDER — FENTANYL CITRATE (PF) 100 MCG/2ML IJ SOLN
INTRAMUSCULAR | Status: DC | PRN
  Administered 2019-05-13: 50 ug via INTRAVENOUS

## 2019-05-13 MED ORDER — LACTATED RINGERS IV SOLN
INTRAVENOUS | Status: DC
  Administered 2019-05-13: 10:00:00 via INTRAVENOUS

## 2019-05-13 MED ORDER — PROPOFOL 10 MG/ML IV BOLUS
INTRAVENOUS | Status: AC
  Filled 2019-05-13: qty 20

## 2019-05-13 MED ORDER — MORPHINE SULFATE (PF) 4 MG/ML IV SOLN
4.0000 mg | Freq: Once | INTRAVENOUS | Status: AC
  Administered 2019-05-13: 4 mg via INTRAVENOUS
  Filled 2019-05-13: qty 1

## 2019-05-13 MED ORDER — HYDROCODONE-ACETAMINOPHEN 5-325 MG PO TABS: 1.0000 | ORAL_TABLET | ORAL | 0 refills | Status: AC | PRN

## 2019-05-13 MED ORDER — MIDAZOLAM HCL 2 MG/2ML IJ SOLN
INTRAMUSCULAR | Status: AC
  Filled 2019-05-13: qty 2

## 2019-05-13 MED ORDER — FENTANYL CITRATE (PF) 100 MCG/2ML IJ SOLN: 25.0000 ug | INTRAMUSCULAR | Status: DC | PRN

## 2019-05-13 MED ORDER — LIDOCAINE 2% (20 MG/ML) 5 ML SYRINGE
INTRAMUSCULAR | Status: AC
  Filled 2019-05-13: qty 5

## 2019-05-13 MED ORDER — PROPOFOL 10 MG/ML IV BOLUS
INTRAVENOUS | Status: DC | PRN
  Administered 2019-05-13: 150 mg via INTRAVENOUS

## 2019-05-13 MED ORDER — LIDOCAINE HCL (PF) 1 % IJ SOLN
INTRAMUSCULAR | Status: AC
  Filled 2019-05-13: qty 5

## 2019-05-13 MED ORDER — CEFAZOLIN SODIUM-DEXTROSE 2-4 GM/100ML-% IV SOLN
2.0000 g | Freq: Once | INTRAVENOUS | Status: AC
  Administered 2019-05-13: 11:00:00 2 g via INTRAVENOUS

## 2019-05-13 MED ORDER — OXYCODONE HCL 5 MG/5ML PO SOLN: 5.0000 mg | Freq: Once | ORAL | Status: DC | PRN

## 2019-05-13 MED ORDER — MIDAZOLAM HCL 5 MG/5ML IJ SOLN
INTRAMUSCULAR | Status: DC | PRN
  Administered 2019-05-13: 2 mg via INTRAVENOUS

## 2019-05-13 MED ORDER — AMOXICILLIN-POT CLAVULANATE 875-125 MG PO TABS: 1.0000 | ORAL_TABLET | Freq: Two times a day (BID) | ORAL | 0 refills | Status: AC

## 2019-05-13 MED ORDER — BUPIVACAINE HCL (PF) 0.25 % IJ SOLN
INTRAMUSCULAR | Status: AC
  Filled 2019-05-13: qty 30

## 2019-05-13 MED ORDER — DEXAMETHASONE SODIUM PHOSPHATE 10 MG/ML IJ SOLN
INTRAMUSCULAR | Status: DC | PRN
  Administered 2019-05-13: 10 mg via INTRAVENOUS

## 2019-05-13 MED ORDER — ONDANSETRON HCL 4 MG/2ML IJ SOLN
INTRAMUSCULAR | Status: DC | PRN
  Administered 2019-05-13: 4 mg via INTRAVENOUS

## 2019-05-13 MED ORDER — LIDOCAINE 2% (20 MG/ML) 5 ML SYRINGE
INTRAMUSCULAR | Status: DC | PRN
  Administered 2019-05-13: 60 mg via INTRAVENOUS

## 2019-05-13 MED ORDER — CEFAZOLIN SODIUM-DEXTROSE 2-4 GM/100ML-% IV SOLN
INTRAVENOUS | Status: AC
  Filled 2019-05-13: qty 100

## 2019-05-13 MED ORDER — SODIUM CHLORIDE 0.9 % IV SOLN
3.0000 g | Freq: Once | INTRAVENOUS | Status: AC
  Administered 2019-05-13: 3 g via INTRAVENOUS
  Filled 2019-05-13: qty 8

## 2019-05-13 MED ORDER — TETANUS-DIPHTH-ACELL PERTUSSIS 5-2.5-18.5 LF-MCG/0.5 IM SUSP
0.5000 mL | Freq: Once | INTRAMUSCULAR | Status: AC
  Administered 2019-05-13: 0.5 mL via INTRAMUSCULAR
  Filled 2019-05-13: qty 0.5

## 2019-05-13 MED ORDER — ONDANSETRON HCL 4 MG/2ML IJ SOLN: 4.0000 mg | Freq: Four times a day (QID) | INTRAMUSCULAR | Status: DC | PRN

## 2019-05-13 MED ORDER — ONDANSETRON HCL 4 MG/2ML IJ SOLN
INTRAMUSCULAR | Status: AC
  Filled 2019-05-13: qty 2

## 2019-05-13 MED ORDER — OXYCODONE HCL 5 MG PO TABS: 5.0000 mg | ORAL_TABLET | Freq: Once | ORAL | Status: DC | PRN

## 2019-05-13 MED ORDER — BUPIVACAINE HCL (PF) 0.25 % IJ SOLN
INTRAMUSCULAR | Status: DC | PRN
  Administered 2019-05-13: 10 mL

## 2019-05-13 MED ORDER — FENTANYL CITRATE (PF) 250 MCG/5ML IJ SOLN
INTRAMUSCULAR | Status: AC
  Filled 2019-05-13: qty 5

## 2019-05-13 MED ORDER — DEXAMETHASONE SODIUM PHOSPHATE 10 MG/ML IJ SOLN
INTRAMUSCULAR | Status: AC
  Filled 2019-05-13: qty 1

## 2019-05-13 MED ORDER — SODIUM CHLORIDE 0.9 % IR SOLN
Status: DC | PRN
  Administered 2019-05-13: 1000 mL

## 2019-05-13 SURGICAL SUPPLY — 63 items
BAG DECANTER FOR FLEXI CONT (MISCELLANEOUS) ×3 IMPLANT
BENZOIN TINCTURE PRP APPL 2/3 (GAUZE/BANDAGES/DRESSINGS) IMPLANT
BLADE CLIPPER SURG (BLADE) IMPLANT
BNDG COHESIVE 2X5 TAN STRL LF (GAUZE/BANDAGES/DRESSINGS) ×3 IMPLANT
BNDG CONFORM 3 STRL LF (GAUZE/BANDAGES/DRESSINGS) ×3 IMPLANT
BNDG ELASTIC 3X5.8 VLCR STR LF (GAUZE/BANDAGES/DRESSINGS) ×3 IMPLANT
BNDG ELASTIC 4X5.8 VLCR STR LF (GAUZE/BANDAGES/DRESSINGS) IMPLANT
BNDG GAUZE ELAST 4 BULKY (GAUZE/BANDAGES/DRESSINGS) ×9 IMPLANT
CLOSURE WOUND 1/2 X4 (GAUZE/BANDAGES/DRESSINGS)
CORD BIPOLAR FORCEPS 12FT (ELECTRODE) IMPLANT
COVER SURGICAL LIGHT HANDLE (MISCELLANEOUS) ×3 IMPLANT
COVER WAND RF STERILE (DRAPES) ×3 IMPLANT
CUFF TOURN SGL QUICK 18X4 (TOURNIQUET CUFF) IMPLANT
CUFF TOURN SGL QUICK 24 (TOURNIQUET CUFF)
CUFF TRNQT CYL 24X4X16.5-23 (TOURNIQUET CUFF) IMPLANT
DRAPE SURG 17X23 STRL (DRAPES) ×3 IMPLANT
DRSG EMULSION OIL 3X3 NADH (GAUZE/BANDAGES/DRESSINGS) IMPLANT
DRSG XEROFORM 1X8 (GAUZE/BANDAGES/DRESSINGS) ×3 IMPLANT
ELECT REM PT RETURN 9FT ADLT (ELECTROSURGICAL)
ELECTRODE REM PT RTRN 9FT ADLT (ELECTROSURGICAL) IMPLANT
GAUZE PACKING IODOFORM 1/4X15 (GAUZE/BANDAGES/DRESSINGS) IMPLANT
GAUZE SPONGE 4X4 12PLY STRL (GAUZE/BANDAGES/DRESSINGS) ×3 IMPLANT
GAUZE XEROFORM 1X8 LF (GAUZE/BANDAGES/DRESSINGS) ×3 IMPLANT
GLOVE BIOGEL M 8.0 STRL (GLOVE) IMPLANT
GLOVE SS BIOGEL STRL SZ 8 (GLOVE) ×1 IMPLANT
GLOVE SUPERSENSE BIOGEL SZ 8 (GLOVE) ×2
GOWN STRL REUS W/ TWL LRG LVL3 (GOWN DISPOSABLE) ×2 IMPLANT
GOWN STRL REUS W/ TWL XL LVL3 (GOWN DISPOSABLE) ×2 IMPLANT
GOWN STRL REUS W/TWL LRG LVL3 (GOWN DISPOSABLE) ×4
GOWN STRL REUS W/TWL XL LVL3 (GOWN DISPOSABLE) ×4
HANDPIECE INTERPULSE COAX TIP (DISPOSABLE)
IV NS IRRIG 3000ML ARTHROMATIC (IV SOLUTION) IMPLANT
KIT BASIN OR (CUSTOM PROCEDURE TRAY) ×3 IMPLANT
KIT TURNOVER KIT B (KITS) ×3 IMPLANT
MANIFOLD NEPTUNE II (INSTRUMENTS) ×3 IMPLANT
NEEDLE HYPO 25GX1X1/2 BEV (NEEDLE) ×3 IMPLANT
NS IRRIG 1000ML POUR BTL (IV SOLUTION) ×3 IMPLANT
PACK ORTHO EXTREMITY (CUSTOM PROCEDURE TRAY) ×3 IMPLANT
PAD ARMBOARD 7.5X6 YLW CONV (MISCELLANEOUS) ×6 IMPLANT
PAD CAST 4YDX4 CTTN HI CHSV (CAST SUPPLIES) IMPLANT
PADDING CAST COTTON 4X4 STRL (CAST SUPPLIES)
SET CYSTO W/LG BORE CLAMP LF (SET/KITS/TRAYS/PACK) IMPLANT
SET HNDPC FAN SPRY TIP SCT (DISPOSABLE) IMPLANT
SOAP 2 % CHG 4 OZ (WOUND CARE) ×3 IMPLANT
SOL PREP POV-IOD 4OZ 10% (MISCELLANEOUS) ×6 IMPLANT
SPONGE LAP 18X18 RF (DISPOSABLE) IMPLANT
SPONGE LAP 4X18 RFD (DISPOSABLE) ×3 IMPLANT
STRIP CLOSURE SKIN 1/2X4 (GAUZE/BANDAGES/DRESSINGS) IMPLANT
SUT ETHILON 4 0 P 3 18 (SUTURE) IMPLANT
SUT ETHILON 5 0 P 3 18 (SUTURE)
SUT ETHILON 5 0 PS 2 18 (SUTURE) ×3 IMPLANT
SUT FIBERWIRE 4-0 18 DIAM BLUE (SUTURE) ×3
SUT NYLON ETHILON 5-0 P-3 1X18 (SUTURE) IMPLANT
SUT PROLENE 4 0 P 3 18 (SUTURE) IMPLANT
SUTURE FIBERWR 4-0 18 DIA BLUE (SUTURE) ×1 IMPLANT
SWAB CULTURE ESWAB REG 1ML (MISCELLANEOUS) IMPLANT
SYR CONTROL 10ML LL (SYRINGE) IMPLANT
TOWEL GREEN STERILE (TOWEL DISPOSABLE) ×3 IMPLANT
TOWEL GREEN STERILE FF (TOWEL DISPOSABLE) ×3 IMPLANT
TUBE CONNECTING 12'X1/4 (SUCTIONS) ×1
TUBE CONNECTING 12X1/4 (SUCTIONS) ×2 IMPLANT
WATER STERILE IRR 1000ML POUR (IV SOLUTION) ×3 IMPLANT
YANKAUER SUCT BULB TIP NO VENT (SUCTIONS) ×3 IMPLANT

## 2019-05-13 NOTE — ED Notes (Signed)
emtala reviewed by this RN 

## 2019-05-13 NOTE — ED Provider Notes (Signed)
[facility] Emergency Department Provider Note  ____________________________________________  Time seen: Approximately 7:28 PM  I have reviewed the triage vital signs and the nursing notes.   HISTORY  Chief Complaint Post-op Problem    HPI Frances Adams is a 43 y.o. female presents to the emergency department for a wound check.  Patient is status post irrigation and debridement of middle finger after patient was bitten by her pet bulldog.  Patient had procedure done at [facility] by Dr. Leonides Grills.  Patient reports that she noticed that right third digit appeared "purple and swollen".  Digit has been sore and she became concerned.  Patient is here for wound check.        Past Medical History:  Diagnosis Date  . Asthma   . Breast cancer [facility]) 2004   right breast cancer - chemotherapy and radiation  . 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 Cancer (HCC)    RT BREAST  . Personal history of chemotherapy 2004  . Personal history of radiation therapy 2004  . Personal history of radiation therapy 2008   mammosite    Patient Active Problem List   Diagnosis Date Noted  . History of breast cancer 08/09/2016    Past Surgical History:  Procedure Laterality Date  . ABDOMINAL HYSTERECTOMY    . 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 BREAST EXCISIONAL BIOPSY Right 2007   calcs, benign  . BREAST LUMPECTOMY Right 2004   breast ca  . BREAST SURGERY      Prior to Admission medications   Medication Sig Start Date End Date Taking? Authorizing Provider  amoxicillin-clavulanate (AUGMENTIN) 875-125 MG tablet Take 1 tablet by mouth 2 (two) times daily for 7 days. 05/13/19 05/20/19 Yes Dayna Barker, MD  HYDROcodone-acetaminophen (NORCO/VICODIN) 5-325 MG tablet Take 1-2 tablets by mouth every 4 (four) hours as needed for up to 7 days for moderate pain. 05/13/19 05/20/19 Yes Dayna Barker, MD     Allergies Patient has no known allergies.  Family History  Problem Relation Age of Onset  . Breast cancer Cousin        13's    Social History Social History   Tobacco Use  . Smoking status: Current Every Day Smoker    Packs/day: 0.50    Types: Cigarettes  . Smokeless tobacco: Never Used  Substance Use Topics  . Alcohol use: Yes    Alcohol/week: 14.0 standard drinks    Types: 14 Shots of liquor per week  . Drug use: No     Review of Systems  Constitutional: No fever/chills Eyes: No visual changes. No discharge ENT: No upper respiratory complaints. Cardiovascular: no chest pain. Respiratory: no cough. No SOB. Gastrointestinal: No abdominal pain.  No nausea, no vomiting.  No diarrhea.  No constipation. Genitourinary: Negative for dysuria. No hematuria Musculoskeletal: Patient has right hand pain.  Skin: Negative for rash, abrasions, lacerations, ecchymosis. Neurological: Negative for headaches, focal weakness or numbness.   ____________________________________________   PHYSICAL EXAM:  VITAL SIGNS: ED Triage Vitals  Enc Vitals Group     BP 05/13/19 1837 105/61     Pulse Rate 05/13/19 1837 (!) 54     Resp 05/13/19 1837 14     Temp 05/13/19 1832 98.6 F (37 C)     Temp Source 05/13/19 1832 Oral     SpO2 05/13/19 1837 97 %     Weight 05/13/19 1833 120  lb (54.4 kg)     Height 05/13/19 1833 5\' 3"  (1.6 m)     Head Circumference --      Peak Flow --      Pain Score 05/13/19 1833 10     Pain Loc --      Pain Edu? --      Excl. in [place]? --      Constitutional: Alert and oriented. Well appearing and in no acute distress. Eyes: Conjunctivae are normal. PERRL. EOMI. Head: Atraumatic.  Cardiovascular: Normal rate, regular rhythm. Normal S1 and S2.  Good peripheral circulation. Respiratory: Normal respiratory effort without tachypnea or retractions. Lungs CTAB. Good air entry to the bases with no decreased or absent breath sounds. Musculoskeletal: Patient is  able to move all 5 right fingers.  Distal dorsal aspect of right third digit has ecchymosis visualized.  There is no ecchymosis along the volar aspect of the digit.  Patient has normal sensation to light touch and capillary refill is less than 2 seconds for all 5 right digits.  Right third digit is the same temperature as other digits.  Do not agree with triage assessment. Neurologic:  Normal speech and language. No gross focal neurologic deficits are appreciated.  Skin:  Skin is warm, dry and intact. No rash noted. Psychiatric: Mood and affect are normal. Speech and behavior are normal. Patient exhibits appropriate insight and judgement.   ____________________________________________   LABS (all labs ordered are listed, but only abnormal results are displayed)  Labs Reviewed - No data to display ____________________________________________  EKG   ____________________________________________  RADIOLOGY I personally viewed and evaluated these images as part of my medical decision making, as well as reviewing the written report by the radiologist.  Dg Hand Complete Right  Result Date: 05/13/2019 CLINICAL DATA:  43 year old female with history of dog bite to the right hand. EXAM: RIGHT HAND - COMPLETE 3+ VIEW COMPARISON:  01/30/2017. FINDINGS: There is an acute highly comminuted displaced fracture of the mid diaphysis of the third proximal phalanx with 1 shaft width of posterior displacement and extensive surrounding soft tissue swelling. Old healed fracture of the distal fifth metacarpal also incidentally noted. IMPRESSION: 1. Acute highly comminuted displaced fracture of the mid diaphysis of the third proximal phalanx. Electronically Signed   By: Vinnie Langton M.D.   On: 05/13/2019 05:39    ____________________________________________    PROCEDURES  Procedure(s) performed:    Procedures    Medications - No data to  display   ____________________________________________   INITIAL IMPRESSION / ASSESSMENT AND PLAN / ED COURSE  Pertinent labs & imaging results that were available during my care of the patient were reviewed by me and considered in my medical decision making (see chart for details).  Review of the [place] CSRS was performed in accordance of the [place] prior to dispensing any controlled drugs.         Assessment and plan Wound check 43 year old female presents to the emergency department for a wound check.  Patient had irrigation and debridement of right middle finger after patient was bitten by her pet bulldog.  Patient's hand was unwrapped for full visualization.  Patient was able to move all 5 right digits.  Right middle finger was understandably swollen given recent surgey in comparison to the other digits and had mild ecchymosis along the dorsal aspect of the digit.  Ecchymosis was not circumferential.  Right middle finger was warm to palpation and was the same temperature in comparison to other digits.  I reviewed triage note and do not agree with assessment by nursing.  Patient's hand was redressed in the emergency department and she was advised to follow-up with Dr. Lenon Curt. All patient questions were answered.     ____________________________________________  FINAL CLINICAL IMPRESSION(S) / ED DIAGNOSES  Final diagnoses:  Visit for wound check      NEW MEDICATIONS STARTED DURING THIS VISIT:  ED Discharge Orders    None          This chart was dictated using voice recognition software/Dragon. Despite best efforts to proofread, errors can occur which can change the meaning. Any change was purely unintentional.    Lannie Fields, PA-C 05/13/19 Diana Eves, MD 05/14/19 [phone]

## 2019-05-13 NOTE — ED Notes (Signed)
Pt's daughter [place] signed consent with pt's consent for transfer. Pt unable to sign due to injury.

## 2019-05-13 NOTE — Anesthesia Postprocedure Evaluation (Signed)
Anesthesia Post Note  Patient: Frances Adams  Procedure(s) Performed: IRRIGATION AND DEBRIDEMENT MIDDLE FINGER (Right Hand) PERCUTANEOUS PINNING (Right Hand)     Patient location during evaluation: PACU Anesthesia Type: General Level of consciousness: awake and alert Pain management: pain level controlled Vital Signs Assessment: post-procedure vital signs reviewed and stable Respiratory status: spontaneous breathing, nonlabored ventilation, respiratory function stable and patient connected to nasal cannula oxygen Cardiovascular status: blood pressure returned to baseline and stable Postop Assessment: no apparent nausea or vomiting Anesthetic complications: no    Last Vitals:  Vitals:   05/13/19 1150 05/13/19 1205  BP: (!) 142/90 118/69  Pulse: (!) 57 68  Resp: 14 17  Temp:  (!) 36.4 C  SpO2: 100% 100%    Last Pain:  Vitals:   05/13/19 1205  TempSrc:   PainSc: 0-No pain                 Carrah Eppolito S

## 2019-05-13 NOTE — ED Triage Notes (Addendum)
Patient states that she was bit by a dog about 30 minutes ago. Patient with laceration to left upper arm, with bleeding controlled. Patient with bit to right third finger with swelling and patient unable to move finger. Patient with bit to left ear.  Patient states that she pleads the fifth when asked what dog bit her. Patient states that the dog's vaccinations are up to date.

## 2019-05-13 NOTE — Anesthesia Procedure Notes (Signed)
Procedure Name: LMA Insertion Date/Time: 05/13/2019 10:49 AM Performed by: Kyung Rudd, CRNA Pre-anesthesia Checklist: Patient identified, Emergency Drugs available, Suction available, Patient being monitored and Timeout performed Patient Re-evaluated:Patient Re-evaluated prior to induction Oxygen Delivery Method: Circle system utilized Preoxygenation: Pre-oxygenation with 100% oxygen Induction Type: IV induction LMA: LMA inserted LMA Size: 3.0 Number of attempts: 1 Placement Confirmation: positive ETCO2 and breath sounds checked- equal and bilateral Tube secured with: Tape Dental Injury: Teeth and Oropharynx as per pre-operative assessment

## 2019-05-13 NOTE — ED Notes (Signed)
Pt st she had surgery on her hand r/t a dog bite at [facility]./ pt st that her hand was wrapped with gauze and coban : " it felt to tight and was looking purple". Pt's daughter in law st that she called the nurse help line regarding the situation and they were advised to come to the ER for further evaluation/ Pt's hand was re-wrapped in triage/ pt st that while she was waiting to be seen she began to loose feeling and sensation in the affected hand. This RN noted that the pt has a delay in light sensation to touch in the 4 fingers (excluding the thumb) and is unable to move them at all on the right hand. The thumb on the right hand was unaffected to the sx mentioned above per pt report. This RN notes that pt's middle finger is discolored and notably cooler than the remaining fingers/thumb. Cap refill in all five digits on the right hand is <3secs. PA at bedside at this time

## 2019-05-13 NOTE — Anesthesia Preprocedure Evaluation (Addendum)
Anesthesia Evaluation  Patient identified by MRN, date of birth, ID band Patient awake    Reviewed: Allergy & Precautions, H&P , NPO status , Patient's Chart, lab work & pertinent test results  Airway Mallampati: II   Neck ROM: full    Dental  (+) Edentulous Upper, Partial Lower   Pulmonary asthma , Current Smoker,    breath sounds clear to auscultation       Cardiovascular negative cardio ROS   Rhythm:regular Rate:Normal     Neuro/Psych    GI/Hepatic   Endo/Other    Renal/GU      Musculoskeletal   Abdominal   Peds  Hematology   Anesthesia Other Findings   Reproductive/Obstetrics H/o breast CA                            Anesthesia Physical Anesthesia Plan  ASA: II  Anesthesia Plan: General   Post-op Pain Management:    Induction: Intravenous  PONV Risk Score and Plan: 2 and Ondansetron, Dexamethasone, Midazolam and Treatment may vary due to age or medical condition  Airway Management Planned: LMA  Additional Equipment:   Intra-op Plan:   Post-operative Plan:   Informed Consent: I have reviewed the patients History and Physical, chart, labs and discussed the procedure including the risks, benefits and alternatives for the proposed anesthesia with the patient or authorized representative who has indicated his/her understanding and acceptance.       Plan Discussed with: CRNA, Anesthesiologist and Surgeon  Anesthesia Plan Comments:         Anesthesia Quick Evaluation

## 2019-05-13 NOTE — ED Notes (Signed)
Pt. Came from [place] per their ED. Pt has an IV in left AC.  Called [facility] and talked with Dr.Mesner in the ED.

## 2019-05-13 NOTE — ED Notes (Addendum)
Officer Matherly with [employer] has spoken with pt regarding animal bite.

## 2019-05-13 NOTE — H&P (Signed)
Reason for Consult:dog bite finger injury Referring Physician: ER  CC:I was trying to break up a dog fight and was bitten  HPI:  Frances Adams is an 43 y.o. right handed female who presents with  Open wounds, fracture to right hand, left upper arm; pt was bitten by her own dogs that were fighting this am.  Pt was seen initially at [facility], had no provider to attend to her; I was called and had pt transferred here     .   Pain is rated at    8/10 and is described as sharp.  Pain is constant.  Pain is made better by rest/immobilization, worse with motion.   Associated signs/symptoms: no other complaints Previous treatment:  None hand related  Past Medical History:  Diagnosis Date  . Asthma   . Breast cancer [facility]) 2004   right breast cancer - chemotherapy and radiation  . 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 Cancer (HCC)    RT BREAST  . Personal history of chemotherapy 2004  . Personal history of radiation therapy 2004  . Personal history of radiation therapy 2008   mammosite    Past Surgical History:  Procedure Laterality Date  . ABDOMINAL HYSTERECTOMY    . 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 BREAST EXCISIONAL BIOPSY Right 2007   calcs, benign  . BREAST LUMPECTOMY Right 2004   breast ca  . BREAST SURGERY      Family History  Problem Relation Age of Onset  . Breast cancer Cousin        15's    Social History:  reports that she has been smoking. She has been smoking about 0.50 packs per day. She has never used smokeless tobacco. She reports current alcohol use. She reports that she does not use drugs.  Allergies: No Known Allergies  Medications: I have reviewed the patient's current medications.  Results for orders placed or performed during the hospital encounter of 05/13/19 (from the past 48 hour(s))  CBC with Differential     Status: Abnormal   Collection Time: 05/13/19  8:45 AM  Result Value  Ref Range   WBC 5.8 4.0 - 10.5 K/uL   RBC 3.83 (L) 3.87 - 5.11 MIL/uL   Hemoglobin 13.0 12.0 - 15.0 g/dL   HCT 39.5 36.0 - 46.0 %   MCV 103.1 (H) 80.0 - 100.0 fL   MCH 33.9 26.0 - 34.0 pg   MCHC 32.9 30.0 - 36.0 g/dL   RDW 13.3 11.5 - 15.5 %   Platelets 266 150 - 400 K/uL   nRBC 0.0 0.0 - 0.2 %   Neutrophils Relative % 45 %   Neutro Abs 2.7 1.7 - 7.7 K/uL   Lymphocytes Relative 44 %   Lymphs Abs 2.5 0.7 - 4.0 K/uL   Monocytes Relative 6 %   Monocytes Absolute 0.4 0.1 - 1.0 K/uL   Eosinophils Relative 4 %   Eosinophils Absolute 0.2 0.0 - 0.5 K/uL   Basophils Relative 1 %   Basophils Absolute 0.1 0.0 - 0.1 K/uL   Immature Granulocytes 0 %   Abs Immature Granulocytes 0.02 0.00 - 0.07 K/uL    Comment: Performed at [facility], [address], [place], [place] [id number]  Basic metabolic panel     Status: Abnormal   Collection Time: 05/13/19  8:45 AM  Result Value Ref Range   Sodium 145 135 -  145 mmol/L   Potassium 3.4 (L) 3.5 - 5.1 mmol/L   Chloride 109 98 - 111 mmol/L   CO2 25 22 - 32 mmol/L   Glucose, Bld 107 (H) 70 - 99 mg/dL   BUN 6 6 - 20 mg/dL   Creatinine, Ser 0.84 0.44 - 1.00 mg/dL   Calcium 8.8 (L) 8.9 - 10.3 mg/dL   GFR calc non Af Amer >60 >60 mL/min   GFR calc Af Amer >60 >60 mL/min   Anion gap 11 5 - 15    Comment: Performed at [facility] [address] [place],  [address]    Dg Hand Complete Right  Result Date: 05/13/2019 CLINICAL DATA:  43 year old female with history of dog bite to the right hand. EXAM: RIGHT HAND - COMPLETE 3+ VIEW COMPARISON:  01/30/2017. FINDINGS: There is an acute highly comminuted displaced fracture of the mid diaphysis of the third proximal phalanx with 1 shaft width of posterior displacement and extensive surrounding soft tissue swelling. Old healed fracture of the distal fifth metacarpal also incidentally noted. IMPRESSION: 1. Acute highly comminuted displaced fracture of the mid diaphysis of the third proximal  phalanx. Electronically Signed   By: Vinnie Langton M.D.   On: 05/13/2019 05:39    Pertinent items are noted in HPI. Temp:  [98 F (36.7 C)-98.2 F (36.8 C)] 98.1 F (36.7 C) (09/12 0738) Pulse Rate:  [60-77] 63 (09/12 0934) Resp:  [16-20] 20 (09/12 0934) BP: (120-209)/(85-116) 120/91 (09/12 0934) SpO2:  [96 %-100 %] 100 % (09/12 0934) Weight:  [54.4 kg] 54.4 kg (09/12 0447) General appearance: alert and cooperative Resp: normal percussion bilaterally Cardio: regular rate and rhythm GI: soft, non-tender; bowel sounds normal; no masses,  no organomegaly Extremities: extremities normal, atraumatic, no cyanosis or edema Except for RMF with lacerations volar and dorsal, obvious deformity, / superficial laceration to left biceps area  Assessment: Dog bite, open fracture, laceration to RLF Plan: Wash out wounds, fracture fixation I have discussed this treatment plan in detail with patient , including the risks of the recommended treatment and surgery, the benefits and the alternatives.  The patient and/or caregiver understands that additional treatment may be necessary.  Shakeya Kerkman C Aryon Nham 05/13/2019, 10:28 AM

## 2019-05-13 NOTE — ED Notes (Signed)
Pt returns to ED post-operatively. Pt states she did not call her hand surgeon. Pt states increased pain, swelling, and discoloration of surgical finger. R 3rd digit is purple, warm, swollen, and painful with movement of finger. Pt states decreased sensation w/ touch pressure to R 3rd digit. Bleeding controlled. Did not remove operative drsg to inspect operative site at this time.

## 2019-05-13 NOTE — ED Provider Notes (Signed)
[facility] Emergency Department Provider Note   ____________________________________________   I have reviewed the triage vital signs and the nursing notes.   HISTORY  Chief Complaint Animal Bite   History limited by: Not Limited   HPI Frances Adams is a 43 y.o. female who presents to the emergency department today because of concerns for dog bites.  The patient states that she was trying to break up a fight between her dogs.  One of her dogs then started biting her.  She is complaining primarily of pain to her right middle finger.  Also complaining of lacerations to her left upper extremity and left ear.  Patient states that her dogs are up-to-date on rabies.  She is unsure when her last tetanus shot was.   Records reviewed. Per medical record review patient has a history of breast cancer.   Past Medical History:  Diagnosis Date  . Asthma   . Breast cancer [facility]) 2004   right breast cancer - chemotherapy and radiation  . 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 Cancer (HCC)    RT BREAST  . Personal history of chemotherapy 2004  . Personal history of radiation therapy 2004  . Personal history of radiation therapy 2008   mammosite    Patient Active Problem List   Diagnosis Date Noted  . History of breast cancer 08/09/2016    Past Surgical History:  Procedure Laterality Date  . ABDOMINAL HYSTERECTOMY    . 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 BREAST EXCISIONAL BIOPSY Right 2007   calcs, benign  . BREAST LUMPECTOMY Right 2004   breast ca  . BREAST SURGERY      Prior to Admission medications   Medication Sig Start Date End Date Taking? Authorizing Provider  baclofen (LIORESAL) 10 MG tablet Take 1 tablet (10 mg total) by mouth 3 (three) times daily. 10/03/18 10/03/19  Caryn Section Linden Dolin, PA-C  meloxicam (MOBIC) 15 MG tablet Take 1 tablet (15 mg total) by mouth daily. 10/03/18 10/03/19   Versie Starks, PA-C    Allergies Patient has no known allergies.  Family History  Problem Relation Age of Onset  . Breast cancer Cousin        76's    Social History Social History   Tobacco Use  . Smoking status: Current Every Day Smoker    Packs/day: 0.50  . Smokeless tobacco: Never Used  Substance Use Topics  . Alcohol use: Yes  . Drug use: No    Review of Systems Constitutional: No fever/chills Eyes: No visual changes. ENT: No sore throat. Cardiovascular: Denies chest pain. Respiratory: Denies shortness of breath. Gastrointestinal: No abdominal pain.  No nausea, no vomiting.  No diarrhea.   Genitourinary: Negative for dysuria. Musculoskeletal: Positive for right third digit pain. Skin: Positive for laceration to left upper arm, left ear.  Neurological: Negative for headaches, focal weakness or numbness.  ____________________________________________   PHYSICAL EXAM:  VITAL SIGNS: ED Triage Vitals [05/13/19 0447]  Enc Vitals Group     BP (!) 209/109     Pulse Rate 77     Resp 18     Temp 98.2 F (36.8 C)     Temp src      SpO2 96 %     Weight 120 lb (54.4 kg)     Height 5\' 2"  (1.575 m)     Head Circumference  Peak Flow      Pain Score 6   Constitutional: Alert and oriented.  Eyes: Conjunctivae are normal.  ENT      Head: Normocephalic      Ears: Left ear with small amount of tissue missing to the mid helix.       Nose: No congestion/rhinnorhea.      Mouth/Throat: Mucous membranes are moist.      Neck: No stridor. Hematological/Lymphatic/Immunilogical: No cervical lymphadenopathy. Cardiovascular: Normal rate, regular rhythm.  No murmurs, rubs, or gallops.  Respiratory: Normal respiratory effort without tachypnea nor retractions. Breath sounds are clear and equal bilaterally. No wheezes/rales/rhonchi. Gastrointestinal: Soft and non tender. No rebound. No guarding.  Genitourinary: Deferred Musculoskeletal: Deformity to right third finger.   Neurologic:  Normal speech and language. No gross focal neurologic deficits are appreciated.  Skin:  Laceration to the left upper arm. Lacerations to right third finger.  Psychiatric: Mood and affect are normal. Speech and behavior are normal. Patient exhibits appropriate insight and judgment.  ____________________________________________    LABS (pertinent positives/negatives)  Pending  ____________________________________________   EKG  None  ____________________________________________    RADIOLOGY  Right hand Displaced comminuted fracture of the third proximal phalanx  ____________________________________________   PROCEDURES  Procedures  ____________________________________________   INITIAL IMPRESSION / ASSESSMENT AND PLAN / ED COURSE  Pertinent labs & imaging results that were available during my care of the patient were reviewed by me and considered in my medical decision making (see chart for details).   Patient presented to the emergency department today with concerns for injury after dog bites.  No significant injuries to the right middle finger.  There is deformity there with lacerations on both sides.  X-ray does show a displaced fracture.  Discussed with hand surgery at [facility].  Will plan on transfer.  Patient was given Tdap and Unasyn emergency department.  ____________________________________________   FINAL CLINICAL IMPRESSION(S) / ED DIAGNOSES  Final diagnoses:  Dog bite, initial encounter  Open displaced fracture of proximal phalanx of right middle finger, initial encounter     Note: This dictation was prepared with Dragon dictation. Any transcriptional errors that result from this process are unintentional     [name], MD 05/13/19 0710

## 2019-05-13 NOTE — ED Triage Notes (Signed)
Patient was bit by her pit bull this AM. Reports going to [place] after being seen here and had surgery on her right hand, middle finger. Patient came back to ER because she Is concerned about her middle finger color and swelling. Patient reports she could feel me touching her fingertip. Bleeding controlled

## 2019-05-13 NOTE — Transfer of Care (Signed)
Immediate Anesthesia Transfer of Care Note  Patient: Frances Adams Revis  Procedure(s) Performed: IRRIGATION AND DEBRIDEMENT MIDDLE FINGER (Right Hand) PERCUTANEOUS PINNING (Right Hand)  Patient Location: PACU  Anesthesia Type:General  Level of Consciousness: drowsy  Airway & Oxygen Therapy: Patient Spontanous Breathing and Patient connected to nasal cannula oxygen  Post-op Assessment: Report given to RN and Post -op Vital signs reviewed and stable  Post vital signs: Reviewed and stable  Last Vitals:  Vitals Value Taken Time  BP 115/58 05/13/19 1135  Temp    Pulse 49 05/13/19 1137  Resp 12 05/13/19 1137  SpO2 100 % 05/13/19 1137  Vitals shown include unvalidated device data.  Last Pain:  Vitals:   05/13/19 0738  TempSrc: Oral  PainSc:          Complications: No apparent anesthesia complications

## 2019-05-13 NOTE — ED Notes (Signed)
Pt assisted up to commode. 

## 2019-05-13 NOTE — ED Notes (Signed)
Patient's right hand wrapped in tefla and kerlex. Good cap refill noted to fingers. Patient able to freely move fingers. Instructed on wound care and dressing application. Verbalized understanding. Patient ambulatory to lobby with steady gait and NAD noted.

## 2019-05-13 NOTE — Discharge Instructions (Signed)
Discharge Instructions:  Keep your dressing clean, dry and in place until instructed to remove by Dr. Adriel Kessen.  If the dressing becomes dirty or wet call the office for instructions during business hours. Elevate the extremity to help with swelling, this will also help with any discomfort. Take your medication as prescribed. No lifting with the injured  extremity. If you feel that the dressing is too tight, you may loosen it, but keep it on; finger tips should be pink; if there is a concern, call the office. [phone] Ice may be used if the injury is a fracture, do not apply ice directly to the skin. Please call the office on the next business day after discharge to arrange a follow up appointment.  Call [phone] between the hours of 9am - 5pm M-Th or 9am - 1pm on Fri. For most hand injuries and/or conditions, you may return to work using the uninjured hand (one handed duty) within 24-72 hours.  A detailed note will be provided to you at your follow up appointment or may contact the office prior to your follow up.    

## 2019-05-13 NOTE — ED Provider Notes (Addendum)
[facility] EMERGENCY DEPARTMENT Provider Note   CSN: [id number] Arrival date & time: 05/13/19  0719     History   Chief Complaint Chief Complaint  Patient presents with  . Animal Bite  . Hand Injury    HPI Frances Adams is a 43 y.o. female with history of asthma, hysterectomy and breast cancer presents today after being bitten by her own dog.  Patient was seen at [facility] and sent to our ER for evaluation by hand surgeon.  Patient reports the dogs were hers and she was trying to break up a fight, they are up-to-date on rabies shot.  She reports laceration and pain/swelling to the right hand, particularly the middle finger as well as a laceration on the left upper arm.  She denies any other injuries.  She denies any head injury, loss conscious, blood thinner use, neck pain, chest pain, abdominal pain, shortness of breath, nausea/vomiting, injury to the lower extremities or any additional concerns today.  Describes pain of the right little finger as constant severe sharp nonradiating worsened with movement palpation and without alleviating factors.  DG right hand: IMPRESSION: 1. Acute highly comminuted displaced fracture of the mid diaphysis of the third proximal phalanx.  Tdap was updated, 3 g Unasyn given, COVID-19 screening test negative.     HPI  Past Medical History:  Diagnosis Date  . Asthma   . Breast cancer [facility]) 2004   right breast cancer - chemotherapy and radiation  . 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 Cancer (HCC)    RT BREAST  . Personal history of chemotherapy 2004  . Personal history of radiation therapy 2004  . Personal history of radiation therapy 2008   mammosite    Patient Active Problem List   Diagnosis Date Noted  . History of breast cancer 08/09/2016    Past Surgical History:  Procedure Laterality Date  . ABDOMINAL HYSTERECTOMY    . 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 BREAST EXCISIONAL BIOPSY Right 2007   calcs, benign  . BREAST LUMPECTOMY Right 2004   breast ca  . BREAST SURGERY       OB History   No obstetric history on file.      Home Medications    Prior to Admission medications   Medication Sig Start Date End Date Taking? Authorizing Provider  baclofen (LIORESAL) 10 MG tablet Take 1 tablet (10 mg total) by mouth 3 (three) times daily. 10/03/18 10/03/19  Caryn Section Linden Dolin, PA-C  meloxicam (MOBIC) 15 MG tablet Take 1 tablet (15 mg total) by mouth daily. 10/03/18 10/03/19  Versie Starks, PA-C    Family History Family History  Problem Relation Age of Onset  . Breast cancer Cousin        42's    Social History Social History   Tobacco Use  . Smoking status: Current Every Day Smoker    Packs/day: 0.50  . Smokeless tobacco: Never Used  Substance Use Topics  . Alcohol use: Yes  . Drug use: No     Allergies   Patient has no known allergies.   Review of Systems Review of Systems Ten systems are reviewed and are negative for acute change except as noted in the HPI   Physical Exam Updated Vital Signs BP (!) 172/116   Pulse 60   Temp 98.1 F (36.7 C) (Oral)   Resp 16  SpO2 100%   Physical Exam Constitutional:      General: She is not in acute distress.    Appearance: Normal appearance. She is well-developed. She is not ill-appearing or diaphoretic.  HENT:     Head: Normocephalic and atraumatic.     Right Ear: External ear normal.     Left Ear: External ear normal.     Nose: Nose normal.  Eyes:     General: Vision grossly intact. Gaze aligned appropriately.     Pupils: Pupils are equal, round, and reactive to light.  Neck:     Musculoskeletal: Normal range of motion.     Trachea: Trachea and phonation normal. No tracheal deviation.  Pulmonary:     Effort: Pulmonary effort is normal. No respiratory distress.  Abdominal:     General: There is no distension.     Palpations: Abdomen  is soft.     Tenderness: There is no abdominal tenderness. There is no guarding or rebound.  Musculoskeletal: Normal range of motion.     Comments: Right hand: Significant lacerations to both sides of the proximal middle finger, swelling and extremely tender to touch.  Capillary refill intact to all 5 fingers, patient reports diminished sensation to the second-third-fourth fingers.  No snuffbox tenderness to palpation.  Patient reports unable to move the middle finger, some movement is intact to the other 4 fingers but with increased pain.  Patient reports difficulty and pain with pronation and supination of the hand as well as with flexion and extension of the wrist.  Radial pulse intact and equal bilaterally.  Compartments soft to palpation.   Skin:    General: Skin is warm and dry.     Capillary Refill: Capillary refill takes less than 2 seconds.          Comments: Laceration to left upper arm, nonbleeding, 3-4 cm in length.  Neurological:     Mental Status: She is alert.     GCS: GCS eye subscore is 4. GCS verbal subscore is 5. GCS motor subscore is 6.     Comments: Speech is clear and goal oriented, follows commands Major Cranial nerves without deficit, no facial droop Moves extremities without ataxia, coordination intact  Psychiatric:        Behavior: Behavior normal.    ED Treatments / Results  Labs (all labs ordered are listed, but only abnormal results are displayed) Labs Reviewed  CBC WITH DIFFERENTIAL/PLATELET - Abnormal; Notable for the following components:      Result Value   RBC 3.83 (*)    MCV 103.1 (*)    All other components within normal limits  BASIC METABOLIC PANEL    EKG None  Radiology Dg Hand Complete Right  Result Date: 05/13/2019 CLINICAL DATA:  43 year old female with history of dog bite to the right hand. EXAM: RIGHT HAND - COMPLETE 3+ VIEW COMPARISON:  01/30/2017. FINDINGS: There is an acute highly comminuted displaced fracture of the mid diaphysis  of the third proximal phalanx with 1 shaft width of posterior displacement and extensive surrounding soft tissue swelling. Old healed fracture of the distal fifth metacarpal also incidentally noted. IMPRESSION: 1. Acute highly comminuted displaced fracture of the mid diaphysis of the third proximal phalanx. Electronically Signed   By: Vinnie Langton M.D.   On: 05/13/2019 05:39    Procedures Procedures (including critical care time)  Medications Ordered in ED Medications  morphine 4 MG/ML injection 4 mg (4 mg Intravenous Given 05/13/19 0849)     Initial  Impression / Assessment and Plan / ED Course  I have reviewed the triage vital signs and the nursing notes.  Pertinent labs & imaging results that were available during my care of the patient were reviewed by me and considered in my medical decision making (see chart for details).    On initial evaluation patient is tired appearing but in no acute distress reports severe pain of the right middle finger she is open fracture there, she is received Tdap and Unasyn PTA.  Will give pain medication and consult hand surgery.  It appears CBC and BMP were not drawn at [facility] will draw here.  Additionally patient with history of hysterectomy, she denies possibility of pregnancy today.  Additionally patient with linear laceration of the left upper arm that occurred during the dog attack, will avoid closure here in the ED for risk of infection. - 8:40 AM: Discussed case with hand surgeon Dr. Lenon Curt, he plans to take patient to operating room today. - Patient reassessed resting comfortably no acute distress, she states understanding of care plan and is agreeable to above.  She has no further questions or concerns.  Case was discussed with Dr. Rex Kras during this visit.  Note: Portions of this report may have been transcribed using voice recognition software. Every effort was made to ensure accuracy; however, inadvertent computerized transcription errors may  still be present. Final Clinical Impressions(s) / ED Diagnoses   Final diagnoses:  Laceration of left upper extremity, initial encounter  Open displaced fracture of proximal phalanx of right middle finger, initial encounter  Dog bite, initial encounter    ED Discharge Orders    None       Gari Crown 05/13/19 [id number]    Deliah Boston, PA-C 05/13/19 1102    Little, Wenda Overland, MD 05/13/19 1142

## 2019-05-13 NOTE — ED Triage Notes (Signed)
Pt. Stated, I was bitten by my dog, pit bull, sent here by [facility] . Dog is up to date on rabies.

## 2019-05-13 NOTE — ED Notes (Signed)
[place] PD called to report dog bite

## 2019-05-13 NOTE — Op Note (Signed)
NAME: Frances Adams, Frances Adams MEDICAL RECORD [id number] ACCOUNT [id number] DATE OF BIRTH:06/13/1976 FACILITY: [facility] LOCATION: [place], [place]  OPERATIVE REPORT  DATE OF PROCEDURE:  05/13/2019  PREOPERATIVE DIAGNOSIS:  Dog bite to the right long finger with complex lacerations and open fracture of the right long finger.  POSTOPERATIVE DIAGNOSIS:  Dog bite to the right long finger with complex lacerations and open fracture of the right long finger.  PROCEDURE:  Exploration of wound of the right long finger, repair of the flexor tendon in zone 2 of the right long finger, open reduction internal fixation of the right long finger proximal phalanx with two 0.35 K wires.  Closure of lacerations totaling  3 cm.  INDICATIONS:  The patient is a 43 year old young lady who was breaking up her dogs fighting when she was bitten in the finger.  She presented to [facility].  They had no one who could take care of her.  She was referred here under my care.  Risks,  benefits, and alternatives of surgery were discussed with her.  She agreed with this course of action.  Consent was obtained.  DESCRIPTION OF PROCEDURE:  The patient was taken to the operating room and placed supine on the operating table.  Anesthesia was administered.  A timeout was performed identifying the correct procedure.  She received preoperative antibiotics.  The arm  was prepped and draped in normal sterile fashion.  A tourniquet was used on the upper arm and inflated to 250 mmHg.  The wound was then evaluated and explored.  There was a significant laceration on the ulnar side of the digit mid proximal phalanx that  was down to the bone.  There was flexor tendon exposed.  There was also a smaller dorsal laceration overlying the proximal phalanx.  Full-thickness skin was debrided from these wounds with scissors.  Irrigation of both of these wounds was performed with  a liter of saline solution.  Next,  exploration of the deeper volar wound revealed that the ulnar side of the FDS tendon was lacerated and holding on by a thread.  With 4-0 FiberWire, this was approximated nicely.  The ulnar side nerve was also  visualized.  It was intact; however, it was debrided some.  On the dorsal aspect, the extensor tendon was evaluated and it was intact.  Both of these wounds were then closed with interrupted 5-0 nylon sutures for a total of approximately 3 cm.  Next, the  fracture was reduced under fluoroscopy.  A 0.035 K wire was driven in a retrograde fashion from the head of the proximal phalanx to the base.  This temporarily reduced the fracture.  The K wire was advanced through the metacarpophalangeal joint and  withdrew out of the PIP joint.  After adequate reduction was obtained from this, a second 0.035 K wire was placed in the same type fashion.  Final x-rays revealed near anatomic reduction in multiple views of the P1 fracture and good pin placement.  The  pins were then cut and bent.  Xeroform ointment and a sterile dressing were applied.  Marcaine was infiltrated around the base of the digit for postoperative pain control.  LN/NUANCE  D:05/13/2019 T:05/13/2019 JOB:[id number]

## 2019-05-14 ENCOUNTER — Encounter (HOSPITAL_COMMUNITY): Payer: Self-pay | Admitting: General Surgery

## 2019-07-14 ENCOUNTER — Other Ambulatory Visit: Payer: Self-pay

## 2019-07-14 ENCOUNTER — Emergency Department: Payer: Self-pay

## 2019-07-14 ENCOUNTER — Encounter: Payer: Self-pay | Admitting: Emergency Medicine

## 2019-07-14 ENCOUNTER — Emergency Department
Admission: EM | Admit: 2019-07-14 | Discharge: 2019-07-14 | Disposition: A | Discharge status: Home / Self Care | Payer: Self-pay | Attending: Emergency Medicine | Admitting: Emergency Medicine

## 2019-07-14 DIAGNOSIS — J45909 Unspecified asthma, uncomplicated: Secondary | ICD-10-CM | POA: Insufficient documentation

## 2019-07-14 DIAGNOSIS — R079 Chest pain, unspecified: Secondary | ICD-10-CM | POA: Insufficient documentation

## 2019-07-14 DIAGNOSIS — F17210 Nicotine dependence, cigarettes, uncomplicated: Secondary | ICD-10-CM | POA: Insufficient documentation

## 2019-07-14 LAB — CBC
HCT: 38.3 % (ref 36.0–46.0)
Hemoglobin: 13.3 g/dL (ref 12.0–15.0)
MCH: 33.2 pg (ref 26.0–34.0)
MCHC: 34.7 g/dL (ref 30.0–36.0)
MCV: 95.5 fL (ref 80.0–100.0)
Platelets: 232 10*3/uL (ref 150–400)
RBC: 4.01 MIL/uL (ref 3.87–5.11)
RDW: 14.2 % (ref 11.5–15.5)
WBC: 6.9 10*3/uL (ref 4.0–10.5)
nRBC: 0 % (ref 0.0–0.2)

## 2019-07-14 LAB — TROPONIN I (HIGH SENSITIVITY)
Troponin I (High Sensitivity): <2 ng/L (ref <18)
Troponin I (High Sensitivity): 3 ng/L (ref <18)

## 2019-07-14 LAB — BASIC METABOLIC PANEL
Anion gap: 11 (ref 5–15)
BUN: 18 mg/dL (ref 6–20)
CO2: 26 mmol/L (ref 22–32)
Calcium: 9.2 mg/dL (ref 8.9–10.3)
Chloride: 101 mmol/L (ref 98–111)
Creatinine, Ser: 0.81 mg/dL (ref 0.44–1.00)
GFR calc Af Amer: >60 mL/min (ref >60)
GFR calc non Af Amer: >60 mL/min (ref >60)
Glucose, Bld: 107 mg/dL — ABNORMAL HIGH (ref 70–99)
Potassium: 3.9 mmol/L (ref 3.5–5.1)
Sodium: 138 mmol/L (ref 135–145)

## 2019-07-14 MED ORDER — SODIUM CHLORIDE 0.9% FLUSH: 3.0000 mL | Freq: Once | INTRAVENOUS | Status: DC

## 2019-07-14 NOTE — ED Notes (Signed)
Light green tube sent to lab 

## 2019-07-14 NOTE — Discharge Instructions (Addendum)
Please return for any worsening pain.  Please follow-up with cardiology.  Dr. Percival Spanish is on call.  He can see you in [place] or his group also has an office across the street from the hospital.  That address is the one that I have given you with Dr. Tyrell Antonio name attached to it.  Dr. Fletcher Anon is not on-call tonight though.  Your EKG and heart blood test the troponins were negative.  But I would like to make doubly certain; that is why I am sending you to the cardiologist.  Call their office on Monday let them know you were in the emergency room with chest pain.  They should be out of see you fairly quickly.  Again return if you are worse at all.

## 2019-07-14 NOTE — ED Provider Notes (Signed)
[facility] Emergency Department Provider Note   ____________________________________________   First MD Initiated Contact with Patient 07/14/19 2213     (approximate)  I have reviewed the triage vital signs and the nursing notes.   HISTORY  Chief Complaint Chest Pain    HPI Frances Adams is a 43 y.o. female who reports she was using her inhaler and then she went to drive and as she was driving she got some chest tightness.  This freaked her out so she went to EMS and EMS sent her here.  She has had chest tightness in the past but never quite this long.  The pain is not pleuritic.  Nothing she seems to do makes it better or worse.  Is tight in the middle of her chest with no radiation no nausea or vomiting there is some associated shortness of breath.         Past Medical History:  Diagnosis Date  . Asthma   . Breast cancer [facility]) 2004   right breast cancer - chemotherapy and radiation  . 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 Cancer (HCC)    RT BREAST  . Personal history of chemotherapy 2004  . Personal history of radiation therapy 2004  . Personal history of radiation therapy 2008   mammosite    Patient Active Problem List   Diagnosis Date Noted  . History of breast cancer 08/09/2016    Past Surgical History:  Procedure Laterality Date  . ABDOMINAL HYSTERECTOMY    . 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 BREAST EXCISIONAL BIOPSY Right 2007   calcs, benign  . BREAST LUMPECTOMY Right 2004   breast ca  . BREAST SURGERY    . I&D EXTREMITY Right 05/13/2019   Procedure: IRRIGATION AND DEBRIDEMENT MIDDLE FINGER;  Surgeon: Dayna Barker, MD;  Location: [place];  Service: Plastics;  Laterality: Right;  . PERCUTANEOUS PINNING Right 05/13/2019   Procedure: PERCUTANEOUS PINNING;  Surgeon: Dayna Barker, MD;  Location: [facility];  Service: Plastics;  Laterality: Right;    Prior to  Admission medications   Not on File    Allergies Patient has no known allergies.  Family History  Problem Relation Age of Onset  . Breast cancer Cousin        42's    Social History Social History   Tobacco Use  . Smoking status: Current Every Day Smoker    Packs/day: 0.50    Types: Cigarettes  . Smokeless tobacco: Never Used  Substance Use Topics  . Alcohol use: Yes    Alcohol/week: 14.0 standard drinks    Types: 14 Shots of liquor per week  . Drug use: No    Review of Systems  Constitutional: No fever/chills Eyes: No visual changes. ENT: No sore throat. Cardiovascular:chest pain. Respiratory:  shortness of breath. Gastrointestinal: No abdominal pain.  No nausea, no vomiting.  No diarrhea.  No constipation. Genitourinary: Negative for dysuria. Musculoskeletal: Negative for back pain. Skin: Negative for rash. Neurological: Negative for headaches, focal weakness   ____________________________________________   PHYSICAL EXAM:  VITAL SIGNS: ED Triage Vitals  Enc Vitals Group     BP 07/14/19 1751 (!) 119/59     Pulse Rate 07/14/19 1751 71     Resp 07/14/19 1751 18     Temp 07/14/19 1751 98.3 F (36.8 C)     Temp Source 07/14/19 1751 Oral  SpO2 07/14/19 1751 100 %     Weight 07/14/19 1732 120 lb (54.4 kg)     Height 07/14/19 1732 5\' 3"  (1.6 m)     Head Circumference --      Peak Flow --      Pain Score 07/14/19 1732 3     Pain Loc --      Pain Edu? --      Excl. in [place]? --    Constitutional: Alert and oriented. Well appearing and in no acute distress. Eyes: Conjunctivae are normal. Head: Atraumatic. Nose: No congestion/rhinnorhea. Mouth/Throat: Mucous membranes are moist.  Oropharynx non-erythematous. Neck: No stridor.  Cardiovascular: Normal rate, regular rhythm. Grossly normal heart sounds.  Good peripheral circulation. Respiratory: Normal respiratory effort.  No retractions. Lungs CTAB. Gastrointestinal: Soft and nontender. No distention. No  abdominal bruits. No CVA tenderness. Musculoskeletal: No lower extremity tenderness nor edema.   Neurologic:  Normal speech and language. No gross focal neurologic deficits are appreciated. N Skin:  Skin is warm, dry and intact. No rash noted.   ____________________________________________   LABS (all labs ordered are listed, but only abnormal results are displayed)  Labs Reviewed  BASIC METABOLIC PANEL - Abnormal; Notable for the following components:      Result Value   Glucose, Bld 107 (*)    All other components within normal limits  CBC  POC URINE PREG, ED  TROPONIN I (HIGH SENSITIVITY)  TROPONIN I (HIGH SENSITIVITY)  TROPONIN I (HIGH SENSITIVITY)  TROPONIN I (HIGH SENSITIVITY)   ____________________________________________  EKG  EKG read interpreted by me shows normal sinus rhythm rate of 77 normal axis no acute ST-T changes ____________________________________________  RADIOLOGY  ED MD interpretation: Rest x-ray read by radiology reviewed by me is negative  Official radiology report(s): Dg Chest 2 View  Result Date: 07/14/2019 CLINICAL DATA:  Substernal chest pain starting today.  Tightness. EXAM: CHEST - 2 VIEW COMPARISON:  09/28/2018 FINDINGS: The heart size and mediastinal contours are within normal limits. Both lungs are clear. The visualized skeletal structures are unremarkable. IMPRESSION: No active cardiopulmonary disease. Electronically Signed   By: Van Clines M.D.   On: 07/14/2019 18:24    ____________________________________________   PROCEDURES  Procedure(s) performed (including Critical Care):  Procedures   ____________________________________________   INITIAL IMPRESSION / ASSESSMENT AND PLAN / ED COURSE  Patient is having chest tightness that is not pleuritic.  She is not hypoxic x-rays and troponin are negative.  Uncertain what this is but it does not appear to be cardiac at this point.  We will let her go have her follow-up with  cardiology just to be sure.     [name] Norman Herrlich Adams was evaluated in Emergency Department on 07/14/2019 for the symptoms described in the history of present illness. She was evaluated in the context of the global COVID-19 pandemic, which necessitated consideration that the patient might be at risk for infection with the SARS-CoV-2 virus that causes COVID-19. Institutional protocols and algorithms that pertain to the evaluation of patients at risk for COVID-19 are in a state of rapid change based on information released by regulatory bodies including the CDC and federal and state organizations. These policies and algorithms were followed during the patient's care in the ED.         ____________________________________________   FINAL CLINICAL IMPRESSION(S) / ED DIAGNOSES  Final diagnoses:  Nonspecific chest pain     ED Discharge Orders    None       Note:  This document was prepared using Dragon voice recognition software and may include unintentional dictation errors.    Nena Polio, MD 07/14/19 2358

## 2019-07-14 NOTE — ED Triage Notes (Signed)
Pt presents to ED via POV with c/o substernal CP that started earlier today, rates pain 3/10, states is a tightness, denies radiation.

## 2019-07-14 NOTE — ED Notes (Signed)
Pt speaking with this RN in NAD, reports 4/10 pain in chest, A&Ox4.

## 2019-07-14 NOTE — ED Provider Notes (Signed)
[facility] Emergency Department Provider Note  ____________________________________________  Time seen: Approximately 5:38 PM  I have reviewed the triage vital signs and the nursing notes.   HISTORY  Chief Complaint Chest Pain   HPI Shantrell Snide Revis is a 43 y.o. female who presents to the emergency department for treatment and evaluation of mid sternal chest pressure that started earlier today. She has had similar issues in the past, but never lasted this long. No alleviating measures attempted. She states that after the pain started, she got really nervous and stopped at the fire station. They checked her BP and heart rate and told her to come to the ER to make sure everything is ok. Remote history of breast cancer. She does smoke cigarettes.  Past Medical History:  Diagnosis Date  . Asthma   . Breast cancer [facility]) 2004   right breast cancer - chemotherapy and radiation  . Breast cancer 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 Cancer (HCC)    RT BREAST  . Personal history of chemotherapy 2004  . Personal history of radiation therapy 2004  . Personal history of radiation therapy 2008   mammosite    Patient Active Problem List   Diagnosis Date Noted  . History of breast cancer 08/09/2016    Past Surgical History:  Procedure Laterality Date  . ABDOMINAL HYSTERECTOMY    . 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 BREAST EXCISIONAL BIOPSY Right 2007   calcs, benign  . BREAST LUMPECTOMY Right 2004   breast ca  . BREAST SURGERY    . I&D EXTREMITY Right 05/13/2019   Procedure: IRRIGATION AND DEBRIDEMENT MIDDLE FINGER;  Surgeon: Dayna Barker, MD;  Location: [place];  Service: Plastics;  Laterality: Right;  . PERCUTANEOUS PINNING Right 05/13/2019   Procedure: PERCUTANEOUS PINNING;  Surgeon: Dayna Barker, MD;  Location: [place];  Service: Plastics;  Laterality: Right;    Prior to Admission medications   Not  on File    Allergies Patient has no known allergies.  Family History  Problem Relation Age of Onset  . Breast cancer Cousin        56's    Social History Social History   Tobacco Use  . Smoking status: Current Every Day Smoker    Packs/day: 0.50    Types: Cigarettes  . Smokeless tobacco: Never Used  Substance Use Topics  . Alcohol use: Yes    Alcohol/week: 14.0 standard drinks    Types: 14 Shots of liquor per week  . Drug use: No    Review of Systems Constitutional: Negative for fever. Respiratory: Negative for cough or shortness of breath Gastrointestinal: No abdominal pain.  No nausea, no vomiting.  Neurological: Negative for headaches, focal weakness or numbness. Psychological: Positive for anxiety. ____________________________________________   PHYSICAL EXAM:  Today's Vitals   07/14/19 1732 07/14/19 1751  BP:  (!) 119/59  Pulse:  71  Resp:  18  Temp:  98.3 F (36.8 C)  TempSrc:  Oral  SpO2:  100%  Weight: 54.4 kg   Height: 5\' 3"  (1.6 m)   PainSc: 3     Body mass index is 21.26 kg/m.   Constitutional: Alert and oriented. Anxious appearing and in no acute distress. Eyes: Conjunctivae are normal.  Head: Atraumatic.Mouth/Throat: Mucous membranes are moist. Neck: No stridor.  Cardiovascular: Normal rate, regular rhythm. Good peripheral circulation. Respiratory: Normal respiratory effort. Musculoskeletal: Full ROM throughout.  Neurologic:  Normal speech and language. No gross focal neurologic deficits are appreciated. Speech is normal. No gait instability. Skin:  Skin is warm, dry and intact. No rash noted. Psychiatric: Mood and affect are normal. Speech and behavior are normal.  ____________________________________________   LABS (all labs ordered are listed, but only abnormal results are displayed)  Labs Reviewed  BASIC METABOLIC PANEL  CBC  POC URINE PREG, ED  TROPONIN I (HIGH SENSITIVITY)    ____________________________________________  EKG  Signed by MD ____________________________________________  RADIOLOGY  Pending. ____________________________________________   PROCEDURES  n/a ____________________________________________   INITIAL IMPRESSION / ASSESSMENT AND PLAN / ED COURSE     Patient was advised that this is a medical screening exam. She is to await ER room assignment. Protocols ordered.. ____________________________________________   FINAL CLINICAL IMPRESSION(S) / ED DIAGNOSES  Final diagnoses:  None       Victorino Dike, FNP 07/14/19 2100    Duffy Bruce, MD 07/15/19 1556

## 2019-07-25 ENCOUNTER — Other Ambulatory Visit: Payer: Self-pay

## 2019-07-25 ENCOUNTER — Emergency Department: Payer: Self-pay | Admitting: Anesthesiology

## 2019-07-25 ENCOUNTER — Encounter
Admission: EM | Disposition: A | Discharge status: Home / Self Care | Payer: Self-pay | Source: Home / Self Care | Attending: Emergency Medicine

## 2019-07-25 ENCOUNTER — Ambulatory Visit
Admission: EM | Admit: 2019-07-25 | Discharge: 2019-07-25 | Disposition: A | Discharge status: Home / Self Care | Payer: Self-pay | Attending: Obstetrics and Gynecology | Admitting: Obstetrics and Gynecology

## 2019-07-25 ENCOUNTER — Encounter: Payer: Self-pay | Admitting: *Deleted

## 2019-07-25 DIAGNOSIS — Z90710 Acquired absence of both cervix and uterus: Secondary | ICD-10-CM | POA: Insufficient documentation

## 2019-07-25 DIAGNOSIS — Z923 Personal history of irradiation: Secondary | ICD-10-CM | POA: Insufficient documentation

## 2019-07-25 DIAGNOSIS — Z803 Family history of malignant neoplasm of breast: Secondary | ICD-10-CM | POA: Insufficient documentation

## 2019-07-25 DIAGNOSIS — Z9221 Personal history of antineoplastic chemotherapy: Secondary | ICD-10-CM | POA: Insufficient documentation

## 2019-07-25 DIAGNOSIS — Z20828 Contact with and (suspected) exposure to other viral communicable diseases: Secondary | ICD-10-CM | POA: Insufficient documentation

## 2019-07-25 DIAGNOSIS — S3141XA Laceration without foreign body of vagina and vulva, initial encounter: Secondary | ICD-10-CM

## 2019-07-25 DIAGNOSIS — F17210 Nicotine dependence, cigarettes, uncomplicated: Secondary | ICD-10-CM | POA: Insufficient documentation

## 2019-07-25 DIAGNOSIS — Z853 Personal history of malignant neoplasm of breast: Secondary | ICD-10-CM | POA: Insufficient documentation

## 2019-07-25 DIAGNOSIS — J45909 Unspecified asthma, uncomplicated: Secondary | ICD-10-CM | POA: Insufficient documentation

## 2019-07-25 DIAGNOSIS — X58XXXA Exposure to other specified factors, initial encounter: Secondary | ICD-10-CM | POA: Insufficient documentation

## 2019-07-25 HISTORY — PX: PERINEAL LACERATION REPAIR: SHX5389

## 2019-07-25 LAB — PROTIME-INR
INR: 0.9 (ref 0.8–1.2)
Prothrombin Time: 12.2 seconds (ref 11.4–15.2)

## 2019-07-25 LAB — BASIC METABOLIC PANEL
Anion gap: 9 (ref 5–15)
BUN: 10 mg/dL (ref 6–20)
CO2: 28 mmol/L (ref 22–32)
Calcium: 9 mg/dL (ref 8.9–10.3)
Chloride: 108 mmol/L (ref 98–111)
Creatinine, Ser: 0.81 mg/dL (ref 0.44–1.00)
GFR calc Af Amer: >60 mL/min (ref >60)
GFR calc non Af Amer: >60 mL/min (ref >60)
Glucose, Bld: 105 mg/dL — ABNORMAL HIGH (ref 70–99)
Potassium: 3.3 mmol/L — ABNORMAL LOW (ref 3.5–5.1)
Sodium: 145 mmol/L (ref 135–145)

## 2019-07-25 LAB — CBC WITH DIFFERENTIAL/PLATELET
Abs Immature Granulocytes: 0 10*3/uL (ref 0.00–0.07)
Basophils Absolute: 0 10*3/uL (ref 0.0–0.1)
Basophils Relative: 1 %
Eosinophils Absolute: 0.2 10*3/uL (ref 0.0–0.5)
Eosinophils Relative: 4 %
HCT: 35.1 % — ABNORMAL LOW (ref 36.0–46.0)
Hemoglobin: 12.1 g/dL (ref 12.0–15.0)
Immature Granulocytes: 0 %
Lymphocytes Relative: 54 %
Lymphs Abs: 2.1 10*3/uL (ref 0.7–4.0)
MCH: 33.2 pg (ref 26.0–34.0)
MCHC: 34.5 g/dL (ref 30.0–36.0)
MCV: 96.4 fL (ref 80.0–100.0)
Monocytes Absolute: 0.3 10*3/uL (ref 0.1–1.0)
Monocytes Relative: 7 %
Neutro Abs: 1.4 10*3/uL — ABNORMAL LOW (ref 1.7–7.7)
Neutrophils Relative %: 34 %
Platelets: 217 10*3/uL (ref 150–400)
RBC: 3.64 MIL/uL — ABNORMAL LOW (ref 3.87–5.11)
RDW: 14.5 % (ref 11.5–15.5)
WBC: 3.9 10*3/uL — ABNORMAL LOW (ref 4.0–10.5)
nRBC: 0 % (ref 0.0–0.2)

## 2019-07-25 LAB — SARS CORONAVIRUS 2 BY RT PCR (HOSPITAL ORDER, PERFORMED IN ~~LOC~~ HOSPITAL LAB): SARS Coronavirus 2: NEGATIVE

## 2019-07-25 SURGERY — SUTURE REPAIR, LACERATION, PERINEUM
Anesthesia: General

## 2019-07-25 MED ORDER — ONDANSETRON HCL 4 MG/2ML IJ SOLN
INTRAMUSCULAR | Status: DC | PRN
  Administered 2019-07-25: 4 mg via INTRAVENOUS

## 2019-07-25 MED ORDER — DOCUSATE SODIUM 100 MG PO CAPS: 100.0000 mg | ORAL_CAPSULE | Freq: Two times a day (BID) | ORAL | Status: DC

## 2019-07-25 MED ORDER — LIDOCAINE HCL (PF) 2 % IJ SOLN
INTRAMUSCULAR | Status: AC
  Filled 2019-07-25: qty 10

## 2019-07-25 MED ORDER — FENTANYL CITRATE (PF) 100 MCG/2ML IJ SOLN
INTRAMUSCULAR | Status: AC
  Filled 2019-07-25: qty 2

## 2019-07-25 MED ORDER — HYDROCODONE-ACETAMINOPHEN 5-325 MG PO TABS: 1.0000 | ORAL_TABLET | Freq: Four times a day (QID) | ORAL | 0 refills | Status: DC | PRN

## 2019-07-25 MED ORDER — BUPIVACAINE HCL (PF) 0.5 % IJ SOLN
INTRAMUSCULAR | Status: AC
  Filled 2019-07-25: qty 30

## 2019-07-25 MED ORDER — GLYCOPYRROLATE 0.2 MG/ML IJ SOLN
INTRAMUSCULAR | Status: AC
  Filled 2019-07-25: qty 1

## 2019-07-25 MED ORDER — GLYCOPYRROLATE 0.2 MG/ML IJ SOLN
INTRAMUSCULAR | Status: DC | PRN
  Administered 2019-07-25: 0.2 mg via INTRAVENOUS

## 2019-07-25 MED ORDER — MORPHINE SULFATE (PF) 4 MG/ML IV SOLN
INTRAVENOUS | Status: AC
  Administered 2019-07-25: 4 mg via INTRAVENOUS
  Filled 2019-07-25: qty 1

## 2019-07-25 MED ORDER — PROPOFOL 10 MG/ML IV BOLUS
INTRAVENOUS | Status: DC | PRN
  Administered 2019-07-25: 110 mg via INTRAVENOUS

## 2019-07-25 MED ORDER — ACETAMINOPHEN 10 MG/ML IV SOLN
INTRAVENOUS | Status: DC | PRN
  Administered 2019-07-25: 1000 mg via INTRAVENOUS

## 2019-07-25 MED ORDER — MORPHINE SULFATE (PF) 4 MG/ML IV SOLN
4.0000 mg | Freq: Once | INTRAVENOUS | Status: AC
  Administered 2019-07-25: 06:00:00 4 mg via INTRAVENOUS

## 2019-07-25 MED ORDER — LACTATED RINGERS IV SOLN
INTRAVENOUS | Status: DC
  Administered 2019-07-25: 07:00:00 via INTRAVENOUS

## 2019-07-25 MED ORDER — DEXAMETHASONE SODIUM PHOSPHATE 10 MG/ML IJ SOLN
INTRAMUSCULAR | Status: AC
  Filled 2019-07-25: qty 1

## 2019-07-25 MED ORDER — HYDROCODONE-ACETAMINOPHEN 5-325 MG PO TABS: 1.0000 | ORAL_TABLET | Freq: Four times a day (QID) | ORAL | Status: DC | PRN

## 2019-07-25 MED ORDER — ONDANSETRON HCL 4 MG/2ML IJ SOLN: 4.0000 mg | Freq: Four times a day (QID) | INTRAMUSCULAR | Status: DC | PRN

## 2019-07-25 MED ORDER — PROPOFOL 10 MG/ML IV BOLUS
INTRAVENOUS | Status: AC
  Filled 2019-07-25: qty 20

## 2019-07-25 MED ORDER — SUGAMMADEX SODIUM 200 MG/2ML IV SOLN
INTRAVENOUS | Status: AC
  Filled 2019-07-25: qty 2

## 2019-07-25 MED ORDER — KETOROLAC TROMETHAMINE 30 MG/ML IJ SOLN
INTRAMUSCULAR | Status: AC
  Filled 2019-07-25: qty 1

## 2019-07-25 MED ORDER — SUCCINYLCHOLINE CHLORIDE 20 MG/ML IJ SOLN
INTRAMUSCULAR | Status: AC
  Filled 2019-07-25: qty 1

## 2019-07-25 MED ORDER — ONDANSETRON HCL 4 MG PO TABS: 4.0000 mg | ORAL_TABLET | Freq: Four times a day (QID) | ORAL | Status: DC | PRN

## 2019-07-25 MED ORDER — FENTANYL CITRATE (PF) 100 MCG/2ML IJ SOLN: 25.0000 ug | INTRAMUSCULAR | Status: DC | PRN

## 2019-07-25 MED ORDER — ONDANSETRON HCL 4 MG/2ML IJ SOLN
INTRAMUSCULAR | Status: AC
  Administered 2019-07-25: 4 mg via INTRAVENOUS
  Filled 2019-07-25: qty 2

## 2019-07-25 MED ORDER — ONDANSETRON HCL 4 MG/2ML IJ SOLN
INTRAMUSCULAR | Status: AC
  Filled 2019-07-25: qty 2

## 2019-07-25 MED ORDER — MIDAZOLAM HCL 2 MG/2ML IJ SOLN
INTRAMUSCULAR | Status: AC
  Filled 2019-07-25: qty 2

## 2019-07-25 MED ORDER — SUCCINYLCHOLINE CHLORIDE 20 MG/ML IJ SOLN
INTRAMUSCULAR | Status: DC | PRN
  Administered 2019-07-25: 100 mg via INTRAVENOUS

## 2019-07-25 MED ORDER — ONDANSETRON HCL 4 MG/2ML IJ SOLN: 4.0000 mg | Freq: Once | INTRAMUSCULAR | Status: DC | PRN

## 2019-07-25 MED ORDER — MIDAZOLAM HCL 2 MG/2ML IJ SOLN
INTRAMUSCULAR | Status: DC | PRN
  Administered 2019-07-25: 2 mg via INTRAVENOUS

## 2019-07-25 MED ORDER — FENTANYL CITRATE (PF) 100 MCG/2ML IJ SOLN
INTRAMUSCULAR | Status: DC | PRN
  Administered 2019-07-25 (×2): 25 ug via INTRAVENOUS

## 2019-07-25 MED ORDER — SODIUM CHLORIDE 0.9 % IV SOLN
INTRAVENOUS | Status: DC | PRN
  Administered 2019-07-25: 20 ug/min via INTRAVENOUS

## 2019-07-25 MED ORDER — SUGAMMADEX SODIUM 200 MG/2ML IV SOLN
INTRAVENOUS | Status: DC | PRN
  Administered 2019-07-25: 120 mg via INTRAVENOUS

## 2019-07-25 MED ORDER — ACETAMINOPHEN NICU IV SYRINGE 10 MG/ML
INTRAVENOUS | Status: AC
  Filled 2019-07-25: qty 1

## 2019-07-25 MED ORDER — IBUPROFEN 600 MG PO TABS
600.0000 mg | ORAL_TABLET | Freq: Four times a day (QID) | ORAL | Status: DC
  Administered 2019-07-25: 600 mg via ORAL
  Filled 2019-07-25: qty 1

## 2019-07-25 MED ORDER — PIPERACILLIN-TAZOBACTAM 3.375 G IVPB 30 MIN
3.3750 g | Freq: Once | INTRAVENOUS | Status: AC
  Administered 2019-07-25: 3.375 g via INTRAVENOUS
  Filled 2019-07-25: qty 50

## 2019-07-25 MED ORDER — ESTROGENS, CONJUGATED 0.625 MG/GM VA CREA
TOPICAL_CREAM | VAGINAL | Status: AC
  Filled 2019-07-25: qty 30

## 2019-07-25 MED ORDER — LIDOCAINE HCL (CARDIAC) PF 100 MG/5ML IV SOSY
PREFILLED_SYRINGE | INTRAVENOUS | Status: DC | PRN
  Administered 2019-07-25: 60 mg via INTRAVENOUS

## 2019-07-25 MED ORDER — ROCURONIUM BROMIDE 50 MG/5ML IV SOLN
INTRAVENOUS | Status: AC
  Filled 2019-07-25: qty 1

## 2019-07-25 MED ORDER — DEXAMETHASONE SODIUM PHOSPHATE 10 MG/ML IJ SOLN
INTRAMUSCULAR | Status: DC | PRN
  Administered 2019-07-25: 10 mg via INTRAVENOUS

## 2019-07-25 MED ORDER — KETOROLAC TROMETHAMINE 30 MG/ML IJ SOLN
INTRAMUSCULAR | Status: DC | PRN
  Administered 2019-07-25: 30 mg via INTRAVENOUS

## 2019-07-25 MED ORDER — ROCURONIUM BROMIDE 100 MG/10ML IV SOLN
INTRAVENOUS | Status: DC | PRN
  Administered 2019-07-25: 5 mg via INTRAVENOUS
  Administered 2019-07-25: 15 mg via INTRAVENOUS

## 2019-07-25 MED ORDER — MENTHOL 3 MG MT LOZG
1.0000 | LOZENGE | OROMUCOSAL | Status: DC | PRN
  Filled 2019-07-25: qty 9

## 2019-07-25 MED ORDER — ESTROGENS, CONJUGATED 0.625 MG/GM VA CREA
TOPICAL_CREAM | VAGINAL | Status: DC | PRN
  Administered 2019-07-25: 1 via VAGINAL

## 2019-07-25 MED ORDER — ONDANSETRON HCL 4 MG/2ML IJ SOLN
4.0000 mg | Freq: Once | INTRAMUSCULAR | Status: AC
  Administered 2019-07-25: 06:00:00 4 mg via INTRAVENOUS

## 2019-07-25 MED ORDER — SIMETHICONE 80 MG PO CHEW: 80.0000 mg | CHEWABLE_TABLET | Freq: Four times a day (QID) | ORAL | Status: DC | PRN

## 2019-07-25 MED ORDER — PHENYLEPHRINE HCL (PRESSORS) 10 MG/ML IV SOLN
INTRAVENOUS | Status: DC | PRN
  Administered 2019-07-25: 50 ug via INTRAVENOUS
  Administered 2019-07-25: 100 ug via INTRAVENOUS
  Administered 2019-07-25: 50 ug via INTRAVENOUS
  Administered 2019-07-25 (×3): 100 ug via INTRAVENOUS
  Administered 2019-07-25: 50 ug via INTRAVENOUS
  Administered 2019-07-25 (×2): 100 ug via INTRAVENOUS

## 2019-07-25 SURGICAL SUPPLY — 58 items
BAG URINE DRAIN 2000ML AR STRL (UROLOGICAL SUPPLIES) ×4 IMPLANT
BLADE SURG SZ11 CARB STEEL (BLADE) IMPLANT
CANISTER SUCT 1200ML W/VALVE (MISCELLANEOUS) ×4 IMPLANT
CATH FOLEY 2WAY  5CC 16FR (CATHETERS) ×2
CATH URTH 16FR FL 2W BLN LF (CATHETERS) ×2 IMPLANT
CHLORAPREP W/TINT 26 (MISCELLANEOUS) ×4 IMPLANT
COVER WAND RF STERILE (DRAPES) ×4 IMPLANT
DERMABOND ADVANCED (GAUZE/BANDAGES/DRESSINGS) ×2
DERMABOND ADVANCED .7 DNX12 (GAUZE/BANDAGES/DRESSINGS) ×2 IMPLANT
DRAPE 3/4 80X56 (DRAPES) ×4 IMPLANT
DRAPE LEGGINS SURG 28X43 STRL (DRAPES) ×4 IMPLANT
DRAPE PERI LITHO V/GYN (MISCELLANEOUS) ×4 IMPLANT
DRAPE UNDER BUTTOCK W/FLU (DRAPES) ×4 IMPLANT
ELECT REM PT RETURN 9FT ADLT (ELECTROSURGICAL) ×4
ELECTRODE REM PT RTRN 9FT ADLT (ELECTROSURGICAL) ×2 IMPLANT
GAUZE 4X4 16PLY RFD (DISPOSABLE) ×4 IMPLANT
GAUZE PACK 2X3YD (GAUZE/BANDAGES/DRESSINGS) ×4 IMPLANT
GLOVE BIO SURGEON STRL SZ7 (GLOVE) ×8 IMPLANT
GLOVE BIOGEL PI IND STRL 7.5 (GLOVE) ×2 IMPLANT
GLOVE BIOGEL PI INDICATOR 7.5 (GLOVE) ×2
GOWN STRL REUS W/ TWL LRG LVL3 (GOWN DISPOSABLE) ×4 IMPLANT
GOWN STRL REUS W/ TWL XL LVL3 (GOWN DISPOSABLE) ×2 IMPLANT
GOWN STRL REUS W/TWL LRG LVL3 (GOWN DISPOSABLE) ×4
GOWN STRL REUS W/TWL XL LVL3 (GOWN DISPOSABLE) ×2
IRRIGATION STRYKERFLOW (MISCELLANEOUS) IMPLANT
IRRIGATOR STRYKERFLOW (MISCELLANEOUS)
IV LACTATED RINGERS 1000ML (IV SOLUTION) ×4 IMPLANT
KIT PINK PAD W/HEAD ARE REST (MISCELLANEOUS) ×4
KIT PINK PAD W/HEAD ARM REST (MISCELLANEOUS) ×2 IMPLANT
KIT TURNOVER CYSTO (KITS) ×4 IMPLANT
LABEL OR SOLS (LABEL) ×4 IMPLANT
MANIPULATOR UTERINE 4.5 ZUMI (MISCELLANEOUS) IMPLANT
NEEDLE HYPO 22GX1.5 SAFETY (NEEDLE) IMPLANT
NS IRRIG 500ML POUR BTL (IV SOLUTION) ×4 IMPLANT
PACK BASIN MINOR ARMC (MISCELLANEOUS) ×4 IMPLANT
PACK LAP CHOLECYSTECTOMY (MISCELLANEOUS) ×4 IMPLANT
PAD OB MATERNITY 4.3X12.25 (PERSONAL CARE ITEMS) ×4 IMPLANT
PAD PREP 24X41 OB/GYN DISP (PERSONAL CARE ITEMS) ×4 IMPLANT
SCISSORS METZENBAUM CVD 33 (INSTRUMENTS) IMPLANT
SET TUBE SMOKE EVAC HIGH FLOW (TUBING) IMPLANT
SHEARS HARMONIC ACE PLUS 36CM (ENDOMECHANICALS) IMPLANT
SLEEVE ENDOPATH XCEL 5M (ENDOMECHANICALS) IMPLANT
SOL PREP PVP 2OZ (MISCELLANEOUS) ×4
SOLUTION PREP PVP 2OZ (MISCELLANEOUS) ×2 IMPLANT
SURGILUBE 2OZ TUBE FLIPTOP (MISCELLANEOUS) ×4 IMPLANT
SUT MNCRL 4-0 (SUTURE)
SUT MNCRL 4-0 27XMFL (SUTURE)
SUT VIC AB 0 CT1 36 (SUTURE) ×4 IMPLANT
SUT VIC AB 2-0 CT2 27 (SUTURE) ×4 IMPLANT
SUT VIC AB 2-0 UR6 27 (SUTURE) IMPLANT
SUT VIC AB 3-0 SH 27 (SUTURE) ×2
SUT VIC AB 3-0 SH 27X BRD (SUTURE) ×2 IMPLANT
SUT VICRYL 3-0 27IN (SUTURE) IMPLANT
SUTURE MNCRL 4-0 27XMF (SUTURE) IMPLANT
SYR CONTROL 10ML LL (SYRINGE) ×4 IMPLANT
TROCAR ENDO BLADELESS 11MM (ENDOMECHANICALS) IMPLANT
TROCAR XCEL NON-BLD 5MMX100MML (ENDOMECHANICALS) ×4 IMPLANT
TROCAR XCEL UNIV SLVE 11M 100M (ENDOMECHANICALS) IMPLANT

## 2019-07-25 NOTE — Anesthesia Preprocedure Evaluation (Signed)
Anesthesia Evaluation  Patient identified by MRN, date of birth, ID band Patient awake    Reviewed: Allergy & Precautions, H&P , NPO status , Patient's Chart, lab work & pertinent test results  Airway Mallampati: II   Neck ROM: full    Dental  (+) Edentulous Upper, Partial Lower   Pulmonary asthma , Current Smoker,    breath sounds clear to auscultation       Cardiovascular negative cardio ROS   Rhythm:regular Rate:Normal     Neuro/Psych negative neurological ROS  negative psych ROS   GI/Hepatic negative GI ROS, Neg liver ROS,   Endo/Other  negative endocrine ROS  Renal/GU negative Renal ROS  Female GU complaint     Musculoskeletal negative musculoskeletal ROS (+)   Abdominal   Peds negative pediatric ROS (+)  Hematology negative hematology ROS (+)   Anesthesia Other Findings Past Medical History: No date: Asthma 2004: Breast cancer ([place])     Comment:  right breast cancer - chemotherapy and radiation 2008: Breast cancer ([place])     Comment:  right breast cancer - mammo site radiation, triple               negative No date: Cancer [facility])     Comment:  RT BREAST 2004: Personal history of chemotherapy 2004: Personal history of radiation therapy 2008: Personal history of radiation therapy     Comment:  mammosite  Reproductive/Obstetrics H/o breast CA                             Anesthesia Physical  Anesthesia Plan  ASA: II  Anesthesia Plan: General   Post-op Pain Management:    Induction: Intravenous  PONV Risk Score and Plan: 2 and Ondansetron, Dexamethasone, Midazolam and Treatment may vary due to age or medical condition  Airway Management Planned: Oral ETT  Additional Equipment:   Intra-op Plan:   Post-operative Plan: Extubation in OR  Informed Consent: I have reviewed the patients History and Physical, chart, labs and discussed the procedure including the risks,  benefits and alternatives for the proposed anesthesia with the patient or authorized representative who has indicated his/her understanding and acceptance.       Plan Discussed with: CRNA, Anesthesiologist and Surgeon  Anesthesia Plan Comments:         Anesthesia Quick Evaluation

## 2019-07-25 NOTE — Progress Notes (Signed)
Discharge order received from doctor. Reviewed discharge instructions and prescriptions with patient and answered all questions. Follow up appointment instructions given. Patient verbalized understanding. Patient discharged home via wheelchair by nursing/auxillary.     Herbert Aguinaldo Garner, RN  

## 2019-07-25 NOTE — Op Note (Signed)
  Operative Note    Pre-Operative Diagnosis: Traumatic vaginal laceration  Post-Operative Diagnosis: Traumatic vaginal laceration  Procedures:  1. Exam under anesthesia 2. Repair of vaginal laceration  Primary Surgeon: Prentice Docker, MD  EBL: 25 mL   IVF: 750 mL   Urine output: 150 mL  Specimens: none  Drains: Foley  Packing:  2" vaginal packing with estrogen cream  Complications: None   Disposition: PACU   Condition: Stable   Anesthesia: General   Findings: vaginal cuff laceration extending distally to nearly the hymenal ring on right side.  Laceration did not extend into the peritoneal cavity  Procedure Summary:   The patient was taken to the operating room where general anesthesia was induced without difficulty.  She was prepped and draped in the usual, sterile fashion after she was placed in the dorsal supine high lithotomy position in candy cane stirrups.  A time out was called.  A red-rubber catheter was utilized to empty her bladder.  An exam under anesthesia was performed with the above-noted findings.    The extension of the vaginal laceration along the right vaginal sulcus was repaired with 2-0 vicryl in a running, locked fashion.  The vaginal cuff laceration was repaired using a 0 vicryl in a running, locked fashion.  There was mild oozing that did not dissipate with pressure and the decision was made to place 2" vaginal packing gauze in her vagina using a topical estrogen cream.  Prior to placement of the vaginal packing, a Foley catheter was inserted.  The vaginal packing was cut with about 1" protruding from the vagina. The packing is a single, elongated packing.    The patient tolerated the procedure well.  Sponge, lap, needle, and instrument counts were correct x 2.  VTE prophylaxis: SCDs. Antibiotic prophylaxis: the patient received Zosyn in the ER from the ER attending. So, no additional antibiotics were given. She was awakened in the operating room and  was taken to the PACU in stable condition.   Prentice Docker, MD 07/25/2019 8:44 AM

## 2019-07-25 NOTE — Discharge Summary (Signed)
DC Summary Discharge Summary   Patient ID: Frances Adams [id number] 43 y.o. [birth year]  Admit date: 07/25/2019  Discharge date: 07/25/2019  Principal Diagnoses:  Traumatic vaginal laceration  Secondary Diagnoses:  none  Procedures performed during the hospitalization:  Exam under anesthesia Repair of vaginal laceration Vaginal packing  HPI: The patient was in her usual state of health until an episode of intercourse late last night during which her partner said he felt a "pop."  Afterward, she had fairly heavy vaginal bleeding and began to pass clots.  She denies pain and denies having pain during the incident. Notably, the patient has a history of a hysterectomy at the age of 49 years for reported endometriosis.  She also has a history of breast cancer in 2002 and she has undergone chemotherapy and radiation therapy. According to her, she had either a recurrence of a new breast cancer in 2008.  She denies any similar issues in the past.    Past Medical History:  Diagnosis Date  . Asthma   . Breast cancer [facility]) 2004   right breast cancer - chemotherapy and radiation  . 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 Cancer (HCC)    RT BREAST  . Personal history of chemotherapy 2004  . Personal history of radiation therapy 2004  . Personal history of radiation therapy 2008   mammosite    Past Surgical History:  Procedure Laterality Date  . ABDOMINAL HYSTERECTOMY    . 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 BREAST EXCISIONAL BIOPSY Right 2007   calcs, benign  . BREAST LUMPECTOMY Right 2004   breast ca  . BREAST SURGERY    . I&D EXTREMITY Right 05/13/2019   Procedure: IRRIGATION AND DEBRIDEMENT MIDDLE FINGER;  Surgeon: Dayna Barker, MD;  Location: [place];  Service: Plastics;  Laterality: Right;  . PERCUTANEOUS PINNING Right 05/13/2019   Procedure: PERCUTANEOUS PINNING;  Surgeon: Dayna Barker, MD;  Location: [place];  Service: Plastics;  Laterality: Right;   Allergies: No Known Allergies  Social History   Tobacco Use  . Smoking status: Current Every Day Smoker    Packs/day: 0.50    Types: Cigarettes  . Smokeless tobacco: Never Used  Substance Use Topics  . Alcohol use: Yes    Alcohol/week: 14.0 standard drinks    Types: 14 Shots of liquor per week  . Drug use: No    Family History  Problem Relation Age of Onset  . Breast cancer [facility] Course:  The patient was admitted directly from the ER and was taken to the OR for exam under anesthesia and repair of vaginal laceration, which occurred without difficulty. She had some mild oozing that was greater than desired at the end of the case. So, she had estrogen-cream coated vaginal packing placed. She was observed for about six hours with vaginal packing and a foley catheter in place (due to the vaginal packing).  After the packing was removed she had min bleeding.  She was discharged in stable condition.   Discharge Exam: BP 110/70 (BP Location: Left Arm)   Pulse 68   Temp 98 F (36.7 C) (Oral)   Resp 20   Ht 5\' 3"  (1.6 m)   Wt 54.3 kg   SpO2 99%   BMI 21.21 kg/m  Physical Exam Constitutional:      General: She is  not in acute distress.    Appearance: Normal appearance.  HENT:     Head: Normocephalic and atraumatic.  Eyes:     General: No scleral icterus.    Conjunctiva/sclera: Conjunctivae normal.  Neurological:     General: No focal deficit present.     Mental Status: She is alert and oriented to person, place, and time.     Cranial Nerves: No cranial nerve deficit.  Psychiatric:        Mood and Affect: Mood normal.        Behavior: Behavior normal.        Judgment: Judgment normal.      Condition at Discharge: Stable  Complications affecting treatment: None  Discharge Medications:  Allergies as of 07/25/2019   No Known Allergies     Medication List    TAKE these medications    HYDROcodone-acetaminophen 5-325 MG tablet Commonly known as: NORCO/VICODIN Take 1 tablet by mouth every 6 (six) hours as needed (breakthrough pain).        Follow-up arrangements:  Follow-up Information    Will Bonnet, MD. Schedule an appointment as soon as possible for a visit in 2 weeks.   Specialty: Obstetrics and Gynecology Why: Post-op follow up Contact information: [address] [place] [address] [phone]            Discharge Disposition: Discharge disposition: 01-Home or Self Care    Signed: Barnett Applebaum, MD, [facility], [facility] 07/25/2019  1:54 PM

## 2019-07-25 NOTE — Discharge Instructions (Signed)
Please call your doctor or return to the ER if you experience any chest pains, shortness of breath, dizziness, visual changes, severe headache (unrelieved by pain meds), fever greater than 101, any heavy bleeding (saturating more than 1 pad per hour), large clots, or foul smelling discharge, any worsening abdominal pain and cramping that is not controlled by pain medication, any calf/leg pain or redness, or any nausea/vomiting.   No tampons, enemas, douches, or sexual intercourse for 6 weeks. Also avoid tub baths, hot tubs, or swimming for 6 weeks.

## 2019-07-25 NOTE — Transfer of Care (Signed)
Immediate Anesthesia Transfer of Care Note  Patient: Frances Adams  Procedure(s) Performed: REPAIR OF VAGINAL LACERATION (N/A ) EXAM UNDER ANESTHESIA  Patient Location: PACU  Anesthesia Type:General  Level of Consciousness: drowsy  Airway & Oxygen Therapy: Patient Spontanous Breathing and Patient connected to face mask oxygen  Post-op Assessment: Report given to RN and Post -op Vital signs reviewed and stable  Post vital signs: Reviewed and stable  Last Vitals:  Vitals Value Taken Time  BP 112/72 07/25/19 0836  Temp    Pulse 81 07/25/19 0836  Resp 19 07/25/19 0836  SpO2 100 % 07/25/19 0836  Vitals shown include unvalidated device data.  Last Pain:  Vitals:   07/25/19 0503  TempSrc: Oral  PainSc:          Complications: No apparent anesthesia complications

## 2019-07-25 NOTE — Anesthesia Postprocedure Evaluation (Signed)
Anesthesia Post Note  Patient: Frances Adams  Procedure(s) Performed: REPAIR OF VAGINAL LACERATION (N/A ) EXAM UNDER ANESTHESIA  Patient location during evaluation: PACU Anesthesia Type: General Level of consciousness: awake and alert Pain management: pain level controlled Vital Signs Assessment: post-procedure vital signs reviewed and stable Respiratory status: spontaneous breathing, nonlabored ventilation, respiratory function stable and patient connected to nasal cannula oxygen Cardiovascular status: blood pressure returned to baseline and stable Postop Assessment: no apparent nausea or vomiting Anesthetic complications: no     Last Vitals:  Vitals:   07/25/19 1038 07/25/19 1210  BP: 102/72 110/70  Pulse: 74 68  Resp: 18 20  Temp: 36.7 C 36.7 C  SpO2: 98% 99%    Last Pain:  Vitals:   07/25/19 1320  TempSrc:   PainSc: 0-No pain                 Martha Clan

## 2019-07-25 NOTE — H&P (Signed)
GYNECOLOGY HISTORY AND PHYSICAL NOTE  GYNECOLOGY SERVICE   [name] [id number] 07/25/2019 8:53 AM    Chief Complaint: Frances Adams is a 43 y.o. female who presented to the ER for vaginal bleeding after intercourse.    History of Present Ilness:   The patient was in her usual state of health until an episode of intercourse late last night during which her partner said he felt a "pop."  Afterward, she had fairly heavy vaginal bleeding and began to pass clots.  She denies pain and denies having pain during the incident. Notably, the patient has a history of a hysterectomy at the age of 46 years for reported endometriosis.  She also has a history of breast cancer in 2002 and she has undergone chemotherapy and radiation therapy. According to her, she had either a recurrence of a new breast cancer in 2008.  She denies any similar issues in the past.    Past Medical History:  Diagnosis Date  . Asthma   . Breast cancer [facility]) 2004   right breast cancer - chemotherapy and radiation  . 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 Cancer (HCC)    RT BREAST  . Personal history of chemotherapy 2004  . Personal history of radiation therapy 2004  . Personal history of radiation therapy 2008   mammosite   Past Surgical History:  Procedure Laterality Date  . ABDOMINAL HYSTERECTOMY    . 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 BREAST EXCISIONAL BIOPSY Right 2007   calcs, benign  . BREAST LUMPECTOMY Right 2004   breast ca  . BREAST SURGERY    . I&D EXTREMITY Right 05/13/2019   Procedure: IRRIGATION AND DEBRIDEMENT MIDDLE FINGER;  Surgeon: Dayna Barker, MD;  Location: [place];  Service: Plastics;  Laterality: Right;  . PERCUTANEOUS PINNING Right 05/13/2019   Procedure: PERCUTANEOUS PINNING;  Surgeon: Dayna Barker, MD;  Location: [place];  Service: Plastics;  Laterality: Right;   No Known Allergies   Prior to Admission  medications   Denies    Social History:  She  reports that she has been smoking cigarettes. She has been smoking about 0.50 packs per day. She has never used smokeless tobacco. She reports current alcohol use of about 14.0 standard drinks of alcohol per week. She reports that she does not use drugs.  Family History:  family history includes Breast cancer in her cousin.   Review of Systems  Constitutional: Negative.   HENT: Negative.   Eyes: Negative.   Respiratory: Negative.   Cardiovascular: Negative.   Gastrointestinal: Negative.   Genitourinary: Negative.        See HPI  Musculoskeletal: Negative.   Skin: Negative.   Neurological: Negative.   Psychiatric/Behavioral: Negative.      Objective    BP 112/72 (BP Location: Left Arm)   Pulse 79   Temp (!) 97.4 F (36.3 C)   Resp 10   Ht 5\' 3"  (1.6 m)   Wt 54.3 kg   SpO2 100%   BMI 21.21 kg/m  Physical Exam  Physical Exam Constitutional:      General: She is not in acute distress.    Appearance: Normal appearance. She is well-developed.  Genitourinary:     Pelvic exam was performed with patient in the lithotomy position.     Vulva, inguinal canal, urethra and bladder normal.     No posterior fourchette tenderness, injury  or lesion present.     There are signs of injury in the vagina.     Vaginal tenderness, bleeding, atrophy and atrophic mucosa present.     Cervix is absent.     Uterus is absent.     Genitourinary Comments: Vaginal laceration noted along the entire vaginal cuff.  There are blood clots obscuring the view of whether this extends to her peritoneal cavity.  She is quite uncomfortable with the exam.  A single digital internal exam in confirmatory. But, I was unable to palpate her adnexa.  No obvious defect noted.  HENT:     Head: Normocephalic and atraumatic.  Eyes:     General: No scleral icterus.    Conjunctiva/sclera: Conjunctivae normal.  Neck:     Musculoskeletal: Normal range of motion and neck  supple.  Cardiovascular:     Rate and Rhythm: Normal rate and regular rhythm.     Heart sounds: No murmur. No friction rub. No gallop.   Pulmonary:     Effort: Pulmonary effort is normal. No respiratory distress.     Breath sounds: Normal breath sounds. No wheezing or rales.  Abdominal:     General: Bowel sounds are normal. There is no distension.     Palpations: Abdomen is soft. There is no mass.     Tenderness: There is no abdominal tenderness. There is no guarding or rebound.  Musculoskeletal: Normal range of motion.  Neurological:     General: No focal deficit present.     Mental Status: She is alert and oriented to person, place, and time.     Cranial Nerves: No cranial nerve deficit.  Skin:    General: Skin is warm and dry.     Findings: No erythema.  Psychiatric:        Mood and Affect: Mood normal.        Behavior: Behavior normal.        Judgment: Judgment normal.    Female chaperone present for pelvic exam:   Lab Results  Component Value Date   [place] NEGATIVE 07/25/2019    Laboratory Results:   Lab Results  Component Value Date   WBC 3.9 (L) 07/25/2019   RBC 3.64 (L) 07/25/2019   HGB 12.1 07/25/2019   HCT 35.1 (L) 07/25/2019   PLT 217 07/25/2019   NA 145 07/25/2019   K 3.3 (L) 07/25/2019   CREATININE 0.81 07/25/2019     Assessment & Plan   Frances Adams is a 43 y.o. female with a traumatic vaginal laceration.  1.  To the operating room for an exam under anesthesia, as I could not get a good exam due to her discomfort. I want to ensure there was no entrance into the peritoneal cavity.  Will also repair her vaginal laceration. I have also consented her a possible laparoscopy, if there are any concerns for intra-peritoneal injury on my exam under anesthesia findings. I personally consented her for the surgery.   2.  I expect her to be discharged after the surgery.    Prentice Docker, MD 07/25/2019 8:53 AM

## 2019-07-25 NOTE — Anesthesia Post-op Follow-up Note (Signed)
Anesthesia QCDR form completed.        

## 2019-07-25 NOTE — ED Triage Notes (Signed)
Pt presents w/ c/o vaginal bleeding <1 hr. Pt had hysterectomy when she was 22. Pt states she had sex tonight and started bleeding at that time. Pt denies pain at this time. Pt states her partner said he "heard something pop" and she began bleeding afterward. Pt provided w/ maxipad in triage. Pt smells of ETOH.

## 2019-07-25 NOTE — Anesthesia Procedure Notes (Signed)
Procedure Name: Intubation Date/Time: 07/25/2019 6:58 AM Performed by: Leeroy Cha, CRNA Pre-anesthesia Checklist: Patient identified, Emergency Drugs available, Suction available and Patient being monitored Patient Re-evaluated:Patient Re-evaluated prior to induction Oxygen Delivery Method: Circle system utilized Preoxygenation: Pre-oxygenation with 100% oxygen Induction Type: IV induction Ventilation: Mask ventilation without difficulty Laryngoscope Size: Mac and 3 Grade View: Grade I Tube type: Oral Tube size: 7.0 mm Number of attempts: 1 Airway Equipment and Method: Stylet and Oral airway Placement Confirmation: ETT inserted through vocal cords under direct vision,  positive ETCO2 and breath sounds checked- equal and bilateral Secured at: 20 cm Tube secured with: Tape Dental Injury: Teeth and Oropharynx as per pre-operative assessment

## 2019-07-25 NOTE — ED Provider Notes (Signed)
[facility] Emergency Department Provider Note  ____________________________________________  Time seen: Approximately 4:50 AM  I have reviewed the triage vital signs and the nursing notes.   HISTORY  Chief Complaint Vaginal Bleeding   HPI Frances Adams is a 43 y.o. female status post total hysterectomy 21 years ago presents for evaluation of vaginal bleeding.  Patient reports that she was have intercourse when her partner felt a pop and patient started having immediate vaginal bleeding.  She has been bleeding continuously since then.  She denies any pain.  She is not on blood thinners.   Past Medical History:  Diagnosis Date  . Asthma   . Breast cancer [facility]) 2004   right breast cancer - chemotherapy and radiation  . Breast cancer [facility]) 2008   right breast cancer - mammo site radiation, triple negative  . Cancer (HCC)    RT BREAST  . Personal history of chemotherapy 2004  . Personal history of radiation therapy 2004  . Personal history of radiation therapy 2008   mammosite    Patient Active Problem List   Diagnosis Date Noted  . History of breast cancer 08/09/2016    Past Surgical History:  Procedure Laterality Date  . ABDOMINAL HYSTERECTOMY    . 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 BREAST EXCISIONAL BIOPSY Right 2007   calcs, benign  . BREAST LUMPECTOMY Right 2004   breast ca  . BREAST SURGERY    . I&D EXTREMITY Right 05/13/2019   Procedure: IRRIGATION AND DEBRIDEMENT MIDDLE FINGER;  Surgeon: Dayna Barker, MD;  Location: [place];  Service: Plastics;  Laterality: Right;  . PERCUTANEOUS PINNING Right 05/13/2019   Procedure: PERCUTANEOUS PINNING;  Surgeon: Dayna Barker, MD;  Location: [place];  Service: Plastics;  Laterality: Right;    Prior to Admission medications   Not on File    Allergies Patient has no known allergies.  Family History  Problem Relation Age of Onset  . Breast cancer Cousin        20's     Social History Social History   Tobacco Use  . Smoking status: Current Every Day Smoker    Packs/day: 0.50    Types: Cigarettes  . Smokeless tobacco: Never Used  Substance Use Topics  . Alcohol use: Yes    Alcohol/week: 14.0 standard drinks    Types: 14 Shots of liquor per week  . Drug use: No    Review of Systems  Constitutional: Negative for fever. Eyes: Negative for visual changes. ENT: Negative for sore throat. Neck: No neck pain  Cardiovascular: Negative for chest pain. Respiratory: Negative for shortness of breath. Gastrointestinal: Negative for abdominal pain, vomiting or diarrhea. Genitourinary: Negative for dysuria. + vaginal bleeding Musculoskeletal: Negative for back pain. Skin: Negative for rash. Neurological: Negative for headaches, weakness or numbness. Psych: No SI or HI  ____________________________________________   PHYSICAL EXAM:  VITAL SIGNS: ED Triage Vitals  Enc Vitals Group     BP 07/25/19 0158 134/79     Pulse Rate 07/25/19 0158 67     Resp 07/25/19 0158 18     Temp 07/25/19 0158 97.8 F (36.6 C)     Temp Source 07/25/19 0158 Oral     SpO2 07/25/19 0158 100 %     Weight 07/25/19 0159 119 lb 11.4 oz (54.3 kg)     Height 07/25/19 0159 5\' 3"  (1.6 m)     Head Circumference --      Peak Flow --  Pain Score 07/25/19 0159 0     Pain Loc --      Pain Edu? --      Excl. in [place]? --     Constitutional: Alert and oriented. Well appearing and in no apparent distress. HEENT:      Head: Normocephalic and atraumatic.         Eyes: Conjunctivae are normal. Sclera is non-icteric.       Mouth/Throat: Mucous membranes are moist.       Neck: Supple with no signs of meningismus. Cardiovascular: Regular rate and rhythm.  Respiratory: Normal respiratory effort.  Gastrointestinal: Soft, non tender, and non distended with positive bowel sounds. No rebound or guarding. Pelvic exam: 4 cm laceration deep in the vaginal cuff area with several large clots  and active slow bleeding   Neurologic: Normal speech and language. Face is symmetric. Moving all extremities. No gross focal neurologic deficits are appreciated. Skin: Skin is warm, dry and intact. No rash noted. Psychiatric: Mood and affect are normal. Speech and behavior are normal.  ____________________________________________   LABS (all labs ordered are listed, but only abnormal results are displayed)  Labs Reviewed  CBC WITH DIFFERENTIAL/PLATELET - Abnormal; Notable for the following components:      Result Value   WBC 3.9 (*)    RBC 3.64 (*)    HCT 35.1 (*)    Neutro Abs 1.4 (*)    All other components within normal limits  BASIC METABOLIC PANEL - Abnormal; Notable for the following components:   Potassium 3.3 (*)    Glucose, Bld 105 (*)    All other components within normal limits  PROTIME-INR  TYPE AND SCREEN   ____________________________________________  EKG  none  ____________________________________________  RADIOLOGY  none  ____________________________________________   PROCEDURES  Procedure(s) performed: None Procedures Critical Care performed:  None ____________________________________________   INITIAL IMPRESSION / ASSESSMENT AND PLAN / ED COURSE   43 y.o. female status post total hysterectomy 21 years ago presents for evaluation of vaginal bleeding that started while having intercourse.  Patient is hemodynamically stable, normal hemoglobin at 12.1.  Pelvic exam showing a 4 cm laceration deep in the vaginal vault around the vaginal cuff area with active slow bleeding and large clots.  Due to the location of the laceration, OB/GYN was consulted.  Dr. Glennon Mac will come to evaluate patient for repair.  In the meantime will give antibiotics and monitor patient closely.    _________________________ 6:55 AM on 07/25/2019 -----------------------------------------  Patient evaluated by Dr. Glennon Mac in the emergency department and taken to the operating  room for repair.  Patient remained hemodynamically stable while in the department.    As part of my medical decision making, I reviewed the following data within the [place] notes reviewed and incorporated, Labs reviewed , Old chart reviewed, A consult was requested and obtained from this/these consultant(s) ObGYN, Notes from prior ED visits and [place] Controlled Substance Database   Please note:  Patient was evaluated in Emergency Department today for the symptoms described in the history of present illness. Patient was evaluated in the context of the global COVID-19 pandemic, which necessitated consideration that the patient might be at risk for infection with the SARS-CoV-2 virus that causes COVID-19. Institutional protocols and algorithms that pertain to the evaluation of patients at risk for COVID-19 are in a state of rapid change based on information released by regulatory bodies including the CDC and federal and state organizations. These policies and algorithms were  followed during the patient's care in the ED.  Some ED evaluations and interventions may be delayed as a result of limited staffing during the pandemic.   ____________________________________________   FINAL CLINICAL IMPRESSION(S) / ED DIAGNOSES   Final diagnoses:  Traumatic vaginal laceration, initial encounter      NEW MEDICATIONS STARTED DURING THIS VISIT:  ED Discharge Orders    None       Note:  This document was prepared using Dragon voice recognition software and may include unintentional dictation errors.    Rudene Re, MD 07/25/19 [phone]

## 2019-07-26 LAB — TYPE AND SCREEN
ABO/RH(D): O POS
Antibody Screen: NEGATIVE

## 2019-08-04 ENCOUNTER — Ambulatory Visit: Payer: Self-pay | Admitting: Cardiovascular Disease

## 2019-08-04 ENCOUNTER — Telehealth: Payer: Self-pay

## 2019-08-04 NOTE — Telephone Encounter (Signed)
As long as it is just a little spotting, she is okay to wait to appt. Just needs to take it easy and not lift a lot of weight. Please advise pt. If she starts bleeding more, she should call back.

## 2019-08-04 NOTE — Telephone Encounter (Signed)
Pt states she picked up her grandson yesterday and now she is having vaginal spotting. Needs to know if she needs to be seen sooner than her appointment on 12/11, pt just had surgery on 07/25/19. Please advise

## 2019-08-04 NOTE — Telephone Encounter (Signed)
Pt aware.

## 2019-08-04 NOTE — Progress Notes (Deleted)
Cardiology Office Note   Date:  08/04/2019   ID:  Frances Adams, DOB 1976-05-07, MRN [id number]  PCP:  Patient, No Pcp Per  Cardiologist:  Kathlyn Sacramento, MD   No chief complaint on file.     History of Present Illness: Frances Adams is a 43 y.o. 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   BREAST EXCISIONAL BIOPSY Right 2007   calcs, benign   BREAST LUMPECTOMY Right 2004   breast ca   BREAST SURGERY     I&D EXTREMITY Right 05/13/2019   Procedure: IRRIGATION AND DEBRIDEMENT MIDDLE FINGER;  Surgeon: Dayna Barker, MD;  Location: [place];  Service: Plastics;  Laterality: Right;   PERCUTANEOUS PINNING Right 05/13/2019   Procedure: PERCUTANEOUS PINNING;  Surgeon: Dayna Barker, MD;  Location: [place];  Service: Plastics;  Laterality: Right;   PERINEAL LACERATION REPAIR N/A 07/25/2019   Procedure: REPAIR OF VAGINAL LACERATION;  Surgeon: Will Bonnet, MD;  Location: [facility];  Service: Gynecology;  Laterality: N/A;     Current Outpatient Medications  Medication Sig Dispense Refill   HYDROcodone-acetaminophen (NORCO/VICODIN) 5-325 MG tablet Take 1 tablet by mouth every 6 (six) hours as needed (breakthrough pain). 20 tablet 0   No current facility-administered medications for this visit.     Allergies:   Patient has no known allergies.    Social History:  The patient  reports that she has been smoking cigarettes. She has been  smoking about 0.50 packs per day. She has never used smokeless tobacco. She reports current alcohol use of about 14.0 standard drinks of alcohol per week. She reports that she does not use drugs.   Family History:  The patient's ***family history includes Breast cancer in her cousin.    ROS:  Please see the history of present illness.   Otherwise, review of systems are positive for {NONE DEFAULTED:18576::"none"}.   All other systems are reviewed and negative.    PHYSICAL EXAM: VS:  There were no vitals taken for this visit. , BMI There is no height or weight on file to calculate BMI. GEN: Well nourished, well developed, in no acute distress  HEENT: normal  Neck: no JVD, carotid bruits, or masses Cardiac: ***RRR; no murmurs, rubs, or gallops,no edema  Respiratory:  clear to auscultation bilaterally, normal work of breathing GI: soft, nontender, nondistended, + BS MS: no deformity or atrophy  Skin: warm and dry, no rash Neuro:  Strength and sensation are intact Psych: euthymic mood, full affect   EKG:  EKG {ACTION; IS/IS [id number] ordered today. The ekg ordered today demonstrates ***   Recent Labs: 09/14/2018: ALT 16 07/25/2019: BUN 10; Creatinine, Ser 0.81; Hemoglobin 12.1; Platelets 217; Potassium 3.3; Sodium 145    Lipid Panel No results found for: CHOL, TRIG, HDL, CHOLHDL, VLDL, LDLCALC, LDLDIRECT    Wt Readings from Last 3 Encounters:  07/25/19  119 lb 11.4 oz (54.3 kg)  07/14/19 120 lb (54.4 kg)  05/13/19 120 lb (54.4 kg)      Other studies Reviewed: Additional studies/ records that were reviewed today include: ***. Review of the above records demonstrates: ***  No flowsheet data found.    ASSESSMENT AND PLAN:  1.  ***    Disposition:   FU with *** in {gen number [id number] {Days to years:10300}  Signed,  Kathlyn Sacramento, MD  08/04/2019 2:14 PM     [facility]

## 2019-08-11 ENCOUNTER — Ambulatory Visit (INDEPENDENT_AMBULATORY_CARE_PROVIDER_SITE_OTHER): Payer: Self-pay | Admitting: Obstetrics and Gynecology

## 2019-08-11 ENCOUNTER — Other Ambulatory Visit: Payer: Self-pay

## 2019-08-11 ENCOUNTER — Encounter: Payer: Self-pay | Admitting: Obstetrics and Gynecology

## 2019-08-11 VITALS — BP 122/78 | Ht 62.0 in | Wt 122.0 lb

## 2019-08-11 DIAGNOSIS — Z09 Encounter for follow-up examination after completed treatment for conditions other than malignant neoplasm: Secondary | ICD-10-CM

## 2019-08-11 DIAGNOSIS — N952 Postmenopausal atrophic vaginitis: Secondary | ICD-10-CM

## 2019-08-11 DIAGNOSIS — S3141XD Laceration without foreign body of vagina and vulva, subsequent encounter: Secondary | ICD-10-CM

## 2019-08-11 MED ORDER — ESTROGENS, CONJUGATED 0.625 MG/GM VA CREA: 0.5000 | TOPICAL_CREAM | VAGINAL | 3 refills | Status: DC

## 2019-08-11 NOTE — Progress Notes (Signed)
   [name] presents post op from EUA and repair of vaginal laceration 2 week ago for traumatic vaginal laceration.  Subjective: Patient reports marked improvement in her preop symptoms. Eating a regular diet without difficulty. The patient is not having any pain.  Activity: normal activities of daily living. Has not had intercourse since the surgery.   Objective: Vitals:   08/11/19 1548  BP: 122/78   Vital Signs: BP 122/78   Ht 5\' 2"  (1.575 m)   Wt 122 lb (55.3 kg)   BMI 22.31 kg/m  Constitutional: Well nourished, well developed female in no acute distress.  HEENT: normal Skin: Warm and dry.  Extremity: no edema  Abdomen: Soft, non-tender, normal bowel sounds; no bruits, organomegaly or masses.  Pelvic exam: (female chaperone present) is not limited by body habitus EGBUS: within normal limits Vagina: stitches in place. Defect remains closed.  Non-tender on pelvic exam.  Cervix: surgically absent    Assessment: 43 y.o. s/p EUA and repair of vaginal laceration progressing well  Plan: Patient has done well after surgery with no apparent complications.  I have discussed the post-operative course to date, and the expected progress moving forward.  The patient understands what complications to be concerned about.  I will see the patient in routine follow up, or sooner if needed.    Activity plan: no sexual activity for 3-4 weeks. Discussed the use of vaginal estrogen twice weekly to reduce atrophic changes in vagina and reduce risk of recurrence of vaginal tears with intercourse.   I asked patient about the episode again. She reiterated that the tear happened during a routine episode of sex.   Prentice Docker, MD 08/11/2019, 4:01 PM

## 2019-08-14 ENCOUNTER — Telehealth: Payer: Self-pay

## 2019-08-14 NOTE — Telephone Encounter (Signed)
Patient seen Friday 08/11/2019. She went to pharmacy and they stated they had not rcvd the rx SDJ told patient he would send. [phone]

## 2019-08-14 NOTE — Telephone Encounter (Signed)
Spoke w/patient. Advised SDJ send rx for premarin vaginal cream on 08/11/2019. Receipt confirmed by pharmacy at 5:02pm. Patient states she was at pharmacy around that time or shortly after. Advised it's possible it had not processed thru to the pharmacy system as they check their incoming rx's every hour on the hour and may not have seen it until 6 pm. She will check with pharmacy and notify us if there are any further issues.

## 2019-08-17 ENCOUNTER — Ambulatory Visit: Payer: Medicaid Other | Admitting: Cardiology

## 2019-08-28 ENCOUNTER — Telehealth: Payer: Self-pay

## 2019-08-28 NOTE — Telephone Encounter (Signed)
The cream is expensive. Was she able to use goodrx or use the web site's discount program?

## 2019-08-28 NOTE — Telephone Encounter (Signed)
Pt called to ask if there was a cheaper cream that could be sent in for her since she is having some issues with receiving the cream from the pharmacy. Thank you

## 2019-08-29 NOTE — Telephone Encounter (Signed)
I called pt again and asked how much the cream was pt stated "it was 400 and something with the discount" pt is still unable to afford this and would like a different recommendation please. Thank you!

## 2019-08-29 NOTE — Telephone Encounter (Signed)
There really are no alternatives. I don't feel comfortable giving her a pill given her history of breast cancer. A topical is probably her best option.

## 2019-08-30 NOTE — Telephone Encounter (Signed)
Please let me know when pt calls me back  

## 2019-08-30 NOTE — Telephone Encounter (Signed)
I am not to sure what he would like me to do going forward. Please advise, Thank you

## 2019-09-12 ENCOUNTER — Ambulatory Visit (INDEPENDENT_AMBULATORY_CARE_PROVIDER_SITE_OTHER): Payer: 59 | Admitting: Cardiovascular Disease

## 2019-09-12 ENCOUNTER — Other Ambulatory Visit: Payer: Self-pay

## 2019-09-12 ENCOUNTER — Encounter: Payer: Self-pay | Admitting: Cardiovascular Disease

## 2019-09-12 VITALS — BP 118/70 | HR 67 | Ht 62.0 in | Wt 125.2 lb

## 2019-09-12 DIAGNOSIS — Z720 Tobacco use: Secondary | ICD-10-CM | POA: Diagnosis not present

## 2019-09-12 DIAGNOSIS — R072 Precordial pain: Secondary | ICD-10-CM

## 2019-09-12 MED ORDER — METOPROLOL TARTRATE 50 MG PO TABS: 50.0000 mg | ORAL_TABLET | Freq: Once | ORAL | 0 refills | Status: DC

## 2019-09-12 NOTE — Patient Instructions (Signed)
Medication Instructions:  Your physician recommends that you continue on your current medications as directed. Please refer to the Current Medication list given to you today.  *If you need a refill on your cardiac medications before your next appointment, please call your pharmacy*  Lab Work: You will need labwork prior to your Cardiac CTA.  Please have your lab (bmet) drawn at the [place] 2-3 days prior to your CT. If you have labs (blood work) drawn today and your tests are completely normal, you will receive your results only by: Marland Kitchen MyChart Message (if you have MyChart) OR . A paper copy in the mail If you have any lab test that is abnormal or we need to change your treatment, we will call you to review the results.  Testing/Procedures: Your physician has requested that you have cardiac CT. Cardiac computed tomography (CT) is a painless test that uses an x-ray machine to take clear, detailed pictures of your heart. For further information please visit [email or web address]. Please follow instruction sheet as given.     Follow-Up: At [facility], you and your health needs are our priority.  As part of our continuing mission to provide you with exceptional heart care, we have created designated Provider Care Teams.  These Care Teams include your primary Cardiologist (physician) and Advanced Practice Providers (APPs -  Physician Assistants and Nurse Practitioners) who all work together to provide you with the care you need, when you need it.  Your next appointment:   As needed   The format for your next appointment:   In Person  Provider:    You may see  Dr. Fletcher Anon  or one of the following Advanced Practice Providers on your designated Care Team:    Murray Hodgkins, NP  Christell Faith, PA-C  Marrianne Mood, PA-C   Other Instructions Your cardiac CT will be scheduled at one of the below locations:   [facility] [address] [place], [place]  [address] [phone]  [place] [address] [place], [place] [address] [phone]  If scheduled at [facility], please arrive at the [facility] main entrance of [facility] 30-45 minutes prior to test start time. Proceed to the [facility] Radiology Department (first floor) to check-in and test prep.  If scheduled at [facility], please arrive 15 mins early for check-in and test prep.  Please follow these instructions carefully (unless otherwise directed):   On the Night Before the Test: . Be sure to Drink plenty of water. . Do not consume any caffeinated/decaffeinated beverages or chocolate 12 hours prior to your test. . Do not take any antihistamines 12 hours prior to your test.   On the Day of the Test: . Drink plenty of water. Do not drink any water within one hour of the test. . Do not eat any food 4 hours prior to the test. . You may take your regular medications prior to the test.  . Take metoprolol (Lopressor) two hours prior to test. (Ax Rx has been sent to your pharmacy. Please pick it up and have it on hand for the day of your scheduled CTA) . FEMALES- please wear underwire-free bra if available       After the Test: . Drink plenty of water. . After receiving IV contrast, you may experience a mild flushed feeling. This is normal. . On occasion, you may experience a mild rash up to 24  hours after the test. This is not dangerous. If this occurs, you can take Benadryl 25 mg and increase your fluid intake. . If you experience trouble breathing, this can be serious. If it is severe call 911 IMMEDIATELY. If it is mild, please call our office. . If you take any of these medications: Glipizide/Metformin, Avandament, Glucavance, please do not take 48 hours after completing test unless otherwise instructed.   Once we have confirmed authorization from your insurance company, we  will call you to set up a date and time for your test.   For non-scheduling related questions, please contact the cardiac imaging nurse navigator should you have any questions/concerns: Marchia Bond, RN Navigator Cardiac Imaging [facility] [phone] Office

## 2019-09-12 NOTE — Progress Notes (Signed)
Cardiology Office Note   Date:  09/12/2019   ID:  Frances Adams, DOB 06-01-1976, MRN [id number]  PCP:  Patient, No Pcp Per  Cardiologist:   Kathlyn Sacramento, MD   Chief Complaint  Patient presents with  . office visit    Pt ED f/u for chest pain. Light tightness in chest. Meds verbally reviewed w/ pt.      History of Present Illness: Frances Adams is a 44 y.o. female who Was referred from [facility] ED for evaluation of chest pain.  She has no prior cardiac history but has prolonged history of tobacco use since she was 44 years old.  She currently smokes few cigarettes a day.  She reports that her mother had myocardial infarction in her 44s.  The patient has no history of diabetes, hypertension or hyperlipidemia.  She did have previous breast cancer in 2004 that was treated at that time with surgery, chemotherapy and radiation therapy. She reports intermittent episodes of substernal chest tightness that happens with exertion with associated shortness of breath.  This started few months ago when she went to the emergency room due to that in November.  Her troponin was normal and EKG showed no ischemic changes.  Since that time, she had intermittent milder episodes of chest pain.    Past Medical History:  Diagnosis Date  . Asthma   . Breast cancer [facility]) 2004   right breast cancer - chemotherapy and radiation  . 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 Cancer (HCC)    RT BREAST  . Personal history of chemotherapy 2004  . Personal history of radiation therapy 2004  . Personal history of radiation therapy 2008   mammosite    Past Surgical History:  Procedure Laterality Date  . ABDOMINAL HYSTERECTOMY    . 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 BREAST EXCISIONAL BIOPSY Right 2007   calcs, benign  . BREAST LUMPECTOMY Right 2004   breast ca  . BREAST SURGERY    . I & D EXTREMITY Right 05/13/2019   Procedure:  IRRIGATION AND DEBRIDEMENT MIDDLE FINGER;  Surgeon: Dayna Barker, MD;  Location: [place];  Service: Plastics;  Laterality: Right;  . PERCUTANEOUS PINNING Right 05/13/2019   Procedure: PERCUTANEOUS PINNING;  Surgeon: Dayna Barker, MD;  Location: [place];  Service: Plastics;  Laterality: Right;  . PERINEAL LACERATION REPAIR N/A 07/25/2019   Procedure: REPAIR OF VAGINAL LACERATION;  Surgeon: Will Bonnet, MD;  Location: [facility];  Service: Gynecology;  Laterality: N/A;     No current outpatient medications on file.   No current facility-administered medications for this visit.    Allergies:   Patient has no known allergies.    Social History:  The patient  reports that she has been smoking cigarettes. She has been smoking about 0.50 packs per day. She has never used smokeless tobacco. She reports current alcohol use of about 14.0 standard drinks of alcohol per week. She reports that she does not use drugs.   Family History:  The patient's family history includes Breast cancer in her cousin.  Mother had myocardial infarction in her 71s   ROS:  Please see the history of present illness.   Otherwise, review of systems are positive for none.   All other systems are reviewed and negative.    PHYSICAL EXAM: VS:  BP 118/70 (BP Location: Left Arm, Patient Position: Sitting, Cuff Size:  Normal)   Pulse 67   Ht 5\' 2"  (1.575 m)   Wt 125 lb 4 oz (56.8 kg)   SpO2 97%   BMI 22.91 kg/m  , BMI Body mass index is 22.91 kg/m. GEN: Well nourished, well developed, in no acute distress  HEENT: normal  Neck: no JVD, carotid bruits, or masses Cardiac: RRR; no murmurs, rubs, or gallops,no edema  Respiratory:  clear to auscultation bilaterally, normal work of breathing GI: soft, nontender, nondistended, + BS MS: no deformity or atrophy  Skin: warm and dry, no rash Neuro:  Strength and sensation are intact Psych: euthymic mood, full affect   EKG:  EKG is ordered today. The ekg ordered today  demonstrates normal sinus rhythm with no significant ST or T wave changes.   Recent Labs: 09/14/2018: ALT 16 07/25/2019: BUN 10; Creatinine, Ser 0.81; Hemoglobin 12.1; Platelets 217; Potassium 3.3; Sodium 145    Lipid Panel No results found for: CHOL, TRIG, HDL, CHOLHDL, VLDL, LDLCALC, LDLDIRECT    Wt Readings from Last 3 Encounters:  09/12/19 125 lb 4 oz (56.8 kg)  08/11/19 122 lb (55.3 kg)  07/25/19 119 lb 11.4 oz (54.3 kg)       PAD Screen 09/12/2019  Previous PAD dx? No  Previous surgical procedure? No  Pain with walking? Yes  Subsides with rest? Yes  Feet/toe relief with dangling? No  Painful, non-healing ulcers? No  Extremities discolored? No      ASSESSMENT AND PLAN:  1.  Chest pain: Some exertional component which is concerning.  She also has prolonged history of tobacco use and family history.  Thus, I recommend evaluation with CTA of the coronary arteries with FFR.  This will help [place] determine if she even have atherosclerosis to justify aggressive treatment. There might be a component of anxiety.  2.  Tobacco use: Discussed smoking cessation.    Disposition:   FU with me as needed  Signed,  Kathlyn Sacramento, MD  09/12/2019 3:56 PM    [place]

## 2019-09-12 NOTE — Telephone Encounter (Signed)
Pt returned my call, I advised her of what SDJ said and she said she would pick up medication bc she has a coupon card now. Pt appreciative and will let me know if she needs anything else.

## 2019-10-04 ENCOUNTER — Telehealth: Payer: Self-pay

## 2019-10-04 NOTE — Telephone Encounter (Signed)
Pt calling; has issue she wants to talk to [place] about.  [phone]  Pt states, "it happened again".  She isn't bleeding quite as heavy but is bleeding, no pain.  Happened with IC.  Be seen or what to do?  Adv we do not have availability for work-ins but will send msg to [facility] for adv.  Can be reached at this same number later today.

## 2019-10-04 NOTE — Telephone Encounter (Signed)
Please advise 

## 2019-10-05 NOTE — Telephone Encounter (Signed)
Schedule with SDJ if available.

## 2019-10-05 NOTE — Telephone Encounter (Signed)
Patient is schedule for 10/12/19 at 3;30 with SDJ

## 2019-10-05 NOTE — Telephone Encounter (Signed)
She needs an appointment. I don't know how soon we can get her in.  But, she does need an appointment.

## 2019-10-12 ENCOUNTER — Encounter: Payer: Self-pay | Admitting: Obstetrics and Gynecology

## 2019-10-12 ENCOUNTER — Ambulatory Visit (INDEPENDENT_AMBULATORY_CARE_PROVIDER_SITE_OTHER): Payer: 59 | Admitting: Obstetrics and Gynecology

## 2019-10-12 ENCOUNTER — Other Ambulatory Visit: Payer: Self-pay

## 2019-10-12 VITALS — BP 118/71 | HR 72 | Ht 62.0 in | Wt 130.0 lb

## 2019-10-12 DIAGNOSIS — S3141XD Laceration without foreign body of vagina and vulva, subsequent encounter: Secondary | ICD-10-CM

## 2019-10-12 DIAGNOSIS — E2839 Other primary ovarian failure: Secondary | ICD-10-CM

## 2019-10-12 DIAGNOSIS — N952 Postmenopausal atrophic vaginitis: Secondary | ICD-10-CM | POA: Diagnosis not present

## 2019-10-12 DIAGNOSIS — N938 Other specified abnormal uterine and vaginal bleeding: Secondary | ICD-10-CM | POA: Diagnosis not present

## 2019-10-12 DIAGNOSIS — Z90710 Acquired absence of both cervix and uterus: Secondary | ICD-10-CM

## 2019-10-12 DIAGNOSIS — N941 Unspecified dyspareunia: Secondary | ICD-10-CM

## 2019-10-12 MED ORDER — ESTRADIOL 0.1 MG/GM VA CREA: 1.0000 | TOPICAL_CREAM | VAGINAL | 3 refills | Status: DC

## 2019-10-12 NOTE — Progress Notes (Signed)
Obstetrics & Gynecology Office Visit    Chief Complaint  Patient presents with  . Vaginal Bleeding    bleeding and painful intercourse    History of Present Illness: 44 y.o. [id number] female who presents due to pain and bleeding after intercourse.  Notably, the patient presented to the emergency room in December with a significant vaginal laceration after intercourse that required operative repair emergently.  On February 1 she noted pain with intercourse followed by what she called significant bleeding.  She states that this occurred for the first few days and has tapered off.  She has no residual pain at the moment.  She has not attempted intercourse since that time.  She has no abdominal pain.  She denies fevers and chills.  Rent is about 6 mm in diameter, hemostatic **Rx for estrogen cream needed   Past Medical History:  Diagnosis Date  . Asthma   . Breast cancer [facility]) 2004   right breast cancer - chemotherapy and radiation  . Breast cancer [facility]) 2008   right breast cancer - mammo site radiation, triple negative  . Cancer (HCC)    RT BREAST  . Personal history of chemotherapy 2004  . Personal history of radiation therapy 2004  . Personal history of radiation therapy 2008   mammosite    Past Surgical History:  Procedure Laterality Date  . ABDOMINAL HYSTERECTOMY    . 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 BREAST EXCISIONAL BIOPSY Right 2007   calcs, benign  . BREAST LUMPECTOMY Right 2004   breast ca  . BREAST SURGERY    . I & D EXTREMITY Right 05/13/2019   Procedure: IRRIGATION AND DEBRIDEMENT MIDDLE FINGER;  Surgeon: Dayna Barker, MD;  Location: [place];  Service: Plastics;  Laterality: Right;  . PERCUTANEOUS PINNING Right 05/13/2019   Procedure: PERCUTANEOUS PINNING;  Surgeon: Dayna Barker, MD;  Location: [place];  Service: Plastics;  Laterality: Right;  . PERINEAL LACERATION REPAIR N/A 07/25/2019   Procedure: REPAIR OF VAGINAL LACERATION;  Surgeon: Will Bonnet, MD;  Location: [facility];  Service: Gynecology;  Laterality: N/A;    Gynecologic History: No LMP recorded. Patient has had a hysterectomy.  Obstetric History: [id number]  Family History  Problem Relation Age of Onset  . Breast cancer Cousin        59's    Social History   Socioeconomic History  . Marital status: Married    Spouse name: Not on file  . Number of children: Not on file  . Years of education: Not on file  . Highest education level: Not on file  Occupational History  . Not on file  Tobacco Use  . Smoking status: Current Every Day Smoker    Packs/day: 0.50    Types: Cigarettes  . Smokeless tobacco: Never Used  Substance and Sexual Activity  . Alcohol use: Yes    Alcohol/week: 14.0 standard drinks    Types: 14 Shots of liquor per week  . Drug use: No  . Sexual activity: Yes    Birth control/protection: None, Surgical    Comment: Hysterectomy  Other Topics Concern  . Not on file  Social History Narrative  . Not on file   Social Determinants of Health   Financial Resource Strain:   . Difficulty of Paying Living Expenses: Not on file  Food Insecurity:   . Worried About Charity fundraiser in the Last Year: Not on file  . Ran Out of Food in the Last  Year: Not on file  Transportation Needs:   . Lack of Transportation (Medical): Not on file  . Lack of Transportation (Non-Medical): Not on file  Physical Activity:   . Days of Exercise per Week: Not on file  . Minutes of Exercise per Session: Not on file  Stress:   . Feeling of Stress : Not on file  Social Connections:   . Frequency of Communication with Friends and Family: Not on file  . Frequency of Social Gatherings with Friends and Family: Not on file  . Attends Religious Services: Not on file  . Active Member of Clubs or Organizations: Not on file  . Attends Archivist Meetings: Not on file  . Marital Status: Not on file  Intimate Partner Violence:   . Fear of Current or  Ex-Partner: Not on file  . Emotionally Abused: Not on file  . Physically Abused: Not on file  . Sexually Abused: Not on file    No Known Allergies  Prior to Admission medications   denies    Review of Systems  Constitutional: Negative.   HENT: Negative.   Eyes: Negative.   Respiratory: Negative.   Cardiovascular: Negative.   Gastrointestinal: Negative.   Genitourinary: Negative.        See HPI  Musculoskeletal: Negative.   Skin: Negative.   Neurological: Negative.   Psychiatric/Behavioral: Negative.      Physical Exam BP 118/71 (BP Location: Left Arm, Patient Position: Sitting, Cuff Size: Normal)   Pulse 72   Ht 5\' 2"  (1.575 m)   Wt 130 lb (59 kg)   BMI 23.78 kg/m  No LMP recorded. Patient has had a hysterectomy. Physical Exam Constitutional:      General: She is not in acute distress.    Appearance: Normal appearance.  Genitourinary:     Pelvic exam was performed with patient in the lithotomy position.     Vulva and inguinal canal normal.     Cervix is absent.     Uterus is absent.     Genitourinary Comments: Approximately 6 mm in diameter tear in her vaginal cuff on the left aspect. This appears to be in the area of the previous repair. No bleeding from this area. It is not tender to palpation, there is no induration or warmth.    HENT:     Head: Normocephalic and atraumatic.  Eyes:     General: No scleral icterus.    Conjunctiva/sclera: Conjunctivae normal.  Abdominal:     General: There is no distension.     Palpations: Abdomen is soft. There is no mass.     Tenderness: There is no abdominal tenderness. There is no guarding or rebound.  Neurological:     General: No focal deficit present.     Mental Status: She is alert and oriented to person, place, and time.     Cranial Nerves: No cranial nerve deficit.  Psychiatric:        Mood and Affect: Mood normal.        Behavior: Behavior normal.        Judgment: Judgment normal.     Female chaperone  present for pelvic and breast  portions of the physical exam  Assessment: 44 y.o. [id number] female here for  1. Traumatic vaginal laceration, subsequent encounter   2. Vaginal atrophy   3. Hypoestrogenism      Plan: Problem List Items Addressed This Visit      Genitourinary   Traumatic vaginal laceration - Primary  Relevant Medications   estradiol (ESTRACE) 0.1 MG/GM vaginal cream    Other Visit Diagnoses    Vaginal atrophy       Relevant Medications   estradiol (ESTRACE) 0.1 MG/GM vaginal cream   Hypoestrogenism       Relevant Medications   estradiol (ESTRACE) 0.1 MG/GM vaginal cream     Will allow her new laceration to heal by secondary intention.  She states that she was unable to obtain Premarin cream after her last visit.  We will try generic estradiol cream as this is much more affordable than Premarin.  She was encouraged to let me know if she has trouble obtaining this prescription.  She should use the cream 2 times per week.  She was instructed to refrain from intercourse for 6 weeks to allow this most recent tear to heal.  15 minutes spent in face to face discussion with > 50% spent in counseling,management, and coordination of care of her traumatic vaginal laceration, vaginal atrophy, hypoestrogenism.   Prentice Docker, MD 10/12/2019 5:12 PM

## 2019-10-20 ENCOUNTER — Telehealth: Payer: Self-pay

## 2019-10-20 NOTE — Telephone Encounter (Signed)
Pt called triage confused about a call she received from her pharmacy. I called [facility] and was told her estradiol is ready for pick $115.99 w ins and $82.80 without ins using goodrx coupon. Pt had mentioned she received letter from ins premarin was covered by ins, pharmacist checked and it is not on formulary. Pt aware.

## 2019-11-13 ENCOUNTER — Telehealth: Payer: Self-pay | Admitting: Cardiovascular Disease

## 2019-11-13 NOTE — Telephone Encounter (Signed)
Spoke with the patient. Advised the patient that the order for the bmet was [facility] for the [facility]. Adv the patient that no appt is needed and of their hours. Adv the patient that the labwork need to be completed prior to her 11/16/19 scheduled Cardiac CTA. Pt sts that she will try to have it done today. Pt voiced appreciation for the call back.

## 2019-11-13 NOTE — Telephone Encounter (Signed)
Please call to discuss where patient should get her labwork drawn.

## 2019-11-14 ENCOUNTER — Other Ambulatory Visit
Admission: RE | Admit: 2019-11-14 | Discharge: 2019-11-14 | Disposition: A | Discharge status: Home / Self Care | Payer: 59 | Source: Ambulatory Visit | Attending: Cardiovascular Disease | Admitting: Cardiovascular Disease

## 2019-11-14 DIAGNOSIS — R072 Precordial pain: Secondary | ICD-10-CM | POA: Insufficient documentation

## 2019-11-14 LAB — BASIC METABOLIC PANEL
Anion gap: 7 (ref 5–15)
BUN: 15 mg/dL (ref 6–20)
CO2: 26 mmol/L (ref 22–32)
Calcium: 8.7 mg/dL — ABNORMAL LOW (ref 8.9–10.3)
Chloride: 105 mmol/L (ref 98–111)
Creatinine, Ser: 0.79 mg/dL (ref 0.44–1.00)
GFR calc Af Amer: >60 mL/min (ref >60)
GFR calc non Af Amer: >60 mL/min (ref >60)
Glucose, Bld: 115 mg/dL — ABNORMAL HIGH (ref 70–99)
Potassium: 3.9 mmol/L (ref 3.5–5.1)
Sodium: 138 mmol/L (ref 135–145)

## 2019-11-16 ENCOUNTER — Other Ambulatory Visit: Payer: Self-pay

## 2019-11-16 ENCOUNTER — Ambulatory Visit
Admission: RE | Admit: 2019-11-16 | Discharge: 2019-11-16 | Disposition: A | Discharge status: Home / Self Care | Payer: 59 | Source: Ambulatory Visit | Attending: Cardiovascular Disease | Admitting: Cardiovascular Disease

## 2019-11-16 DIAGNOSIS — R072 Precordial pain: Secondary | ICD-10-CM | POA: Insufficient documentation

## 2019-11-16 MED ORDER — METOPROLOL TARTRATE 5 MG/5ML IV SOLN
5.0000 mg | INTRAVENOUS | Status: DC | PRN
  Administered 2019-11-16: 5 mg via INTRAVENOUS

## 2019-11-16 MED ORDER — IOHEXOL 350 MG/ML SOLN
85.0000 mL | Freq: Once | INTRAVENOUS | Status: AC | PRN
  Administered 2019-11-16: 16:00:00 85 mL via INTRAVENOUS

## 2019-11-16 MED ORDER — NITROGLYCERIN 0.4 MG SL SUBL
0.8000 mg | SUBLINGUAL_TABLET | Freq: Once | SUBLINGUAL | Status: AC
  Administered 2019-11-16: 0.8 mg via SUBLINGUAL

## 2019-11-16 NOTE — Progress Notes (Signed)
Patient tolerated CT well. Drank a soda after. Ambulated to exit steady gait.

## 2019-11-17 ENCOUNTER — Telehealth: Payer: Self-pay

## 2019-11-17 NOTE — Telephone Encounter (Signed)
Spoke with patient regarding results.  Patient verbalizes understanding. Advised patient to call back with any issues or concerns.  

## 2019-11-17 NOTE — Telephone Encounter (Signed)
-----   Message from Wellington Hampshire, MD sent at 11/17/2019 11:39 AM EDT ----- Normal cardiac CTA with no evidence of atherosclerosis.  This is excellent news.

## 2020-06-05 ENCOUNTER — Telehealth: Payer: Self-pay | Admitting: *Deleted

## 2020-06-05 NOTE — Telephone Encounter (Signed)
Looks like she has insurance now.  If so, she will not be eligible for BCCCP.  Alyse Low could you call her to confirm  if she has insurance please.  If she does, see if she has a PCP.

## 2020-06-05 NOTE — Telephone Encounter (Signed)
Yes, she needs a PCP.

## 2020-06-05 NOTE — Telephone Encounter (Signed)
Patient called asking for mammogram to be scheduled,. She has not seen Dr Grayland Ormond since 2016. She did have mammogram through  [facility] since then. Please advise.

## 2021-07-02 ENCOUNTER — Emergency Department
Admission: EM | Admit: 2021-07-02 | Discharge: 2021-07-02 | Disposition: A | Discharge status: Home / Self Care | Payer: Medicaid Other | Attending: Emergency Medicine | Admitting: Emergency Medicine

## 2021-07-02 ENCOUNTER — Other Ambulatory Visit: Payer: Self-pay

## 2021-07-02 ENCOUNTER — Emergency Department: Payer: Medicaid Other

## 2021-07-02 DIAGNOSIS — R42 Dizziness and giddiness: Secondary | ICD-10-CM | POA: Insufficient documentation

## 2021-07-02 DIAGNOSIS — R0602 Shortness of breath: Secondary | ICD-10-CM | POA: Insufficient documentation

## 2021-07-02 DIAGNOSIS — Z5321 Procedure and treatment not carried out due to patient leaving prior to being seen by health care provider: Secondary | ICD-10-CM | POA: Insufficient documentation

## 2021-07-02 DIAGNOSIS — R110 Nausea: Secondary | ICD-10-CM | POA: Insufficient documentation

## 2021-07-02 DIAGNOSIS — R072 Precordial pain: Secondary | ICD-10-CM | POA: Insufficient documentation

## 2021-07-02 LAB — COMPREHENSIVE METABOLIC PANEL
ALT: 16 U/L (ref 0–44)
AST: 21 U/L (ref 15–41)
Albumin: 4.7 g/dL (ref 3.5–5.0)
Alkaline Phosphatase: 70 U/L (ref 38–126)
Anion gap: 8 (ref 5–15)
BUN: 16 mg/dL (ref 6–20)
CO2: 27 mmol/L (ref 22–32)
Calcium: 9.3 mg/dL (ref 8.9–10.3)
Chloride: 101 mmol/L (ref 98–111)
Creatinine, Ser: 0.85 mg/dL (ref 0.44–1.00)
GFR, Estimated: >60 mL/min (ref >60)
Glucose, Bld: 101 mg/dL — ABNORMAL HIGH (ref 70–99)
Potassium: 3.7 mmol/L (ref 3.5–5.1)
Sodium: 136 mmol/L (ref 135–145)
Total Bilirubin: 0.7 mg/dL (ref 0.3–1.2)
Total Protein: 7.5 g/dL (ref 6.5–8.1)

## 2021-07-02 LAB — CBC
HCT: 39.2 % (ref 36.0–46.0)
Hemoglobin: 13.9 g/dL (ref 12.0–15.0)
MCH: 35.5 pg — ABNORMAL HIGH (ref 26.0–34.0)
MCHC: 35.5 g/dL (ref 30.0–36.0)
MCV: 100 fL (ref 80.0–100.0)
Platelets: 263 10*3/uL (ref 150–400)
RBC: 3.92 MIL/uL (ref 3.87–5.11)
RDW: 13.2 % (ref 11.5–15.5)
WBC: 7.9 10*3/uL (ref 4.0–10.5)
nRBC: 0 % (ref 0.0–0.2)

## 2021-07-02 LAB — TROPONIN I (HIGH SENSITIVITY): Troponin I (High Sensitivity): 3 ng/L (ref <18)

## 2021-07-02 NOTE — ED Triage Notes (Signed)
Pt in with co midsternal chest pain that started tonight. Also co shob, nausea and dizziness. Pt denies any hx of the same.

## 2023-09-02 ENCOUNTER — Ambulatory Visit: Payer: Self-pay | Admitting: *Deleted

## 2023-09-02 NOTE — Telephone Encounter (Signed)
  Chief Complaint: Lump Symptoms: Small Tip of a pen size lump right side of neck Where I had radiation treatments years ago H/O breast CA. Tender to touch Frequency: Noted 2 days ago. Pertinent Negatives: Patient denies  Disposition: [] ED /[] Urgent Care (no appt availability in office) / [x] Appointment(In office/virtual)/ []  [place] [facility]/ [] Home Care/ [] Refused Recommended Disposition /[] [facility]/ []  Follow-up with PCP Additional Notes:  No PCP. Agent secured appt to establish care at [facility]. Advised ED for worsening symptoms.  Reason for Disposition  [1] Small swelling or lump AND [2] unexplained AND [3] present > 1 week  Answer Assessment - Initial Assessment Questions 1. APPEARANCE of SWELLING: What does it look like?     Hard and tender 2. SIZE: How large is the swelling? (e.g., inches, cm; or compare to size of pinhead, tip of pen, eraser, coin, pea, grape, ping pong ball)      End of pen 3. LOCATION: Where is the swelling located?     Right side of neck 4. ONSET: When did the swelling start?     2 days ago 5. COLOR: What color is it? Is there more than one color?     NA 6. PAIN: Is there any pain? If Yes, ask: How bad is the pain? (e.g., scale 1-10; or mild, moderate, severe)     - NONE (0): no pain   - MILD (1-3): doesn't interfere with normal activities    - MODERATE (4-7): interferes with normal activities or awakens from sleep    - SEVERE (8-10): excruciating pain, unable to do any normal activities     Tender to touch 7. ITCH: Does it itch? If Yes, ask: How bad is the itch?      No 8. CAUSE: What do you think caused the swelling? 9 OTHER SYMPTOMS: Do you have any other symptoms? (e.g., fever)     No  Protocols used: Skin Lump or Localized Swelling-A-AH

## 2023-09-06 ENCOUNTER — Telehealth: Payer: Self-pay | Admitting: Family Medicine

## 2023-09-06 NOTE — Telephone Encounter (Signed)
  Please give me a call back I would like to get a nurse opion about std

## 2023-09-06 NOTE — Telephone Encounter (Signed)
 Call to client who reports had stomach pain for approximately 1 year and evaluated by [facility] and cause for pain could not be found. Reports stomach pain has recurred, but feels different than the stomach pain she was evaluated for at [facility] in the past. Reports treated for PID in the past, but since that time has had a total hysterectomy. Reports currently having a clear vaginal discharge and concerned has an infection. Questioning if using spit during intercourse could cause an infection. Client has appt with PCP 09/23/23 (has Medicaid), but desires STI appt now. Counseled wet prep results would be available day of appt, but [facility] test results could take up to 3 weeks to return. Also counseled that [facility] could not take care of stomach issues at appt. Client still desires appt for STI testing. Appt scheduled for 09/08/23. Burnadette Lowers, RN

## 2023-09-08 ENCOUNTER — Ambulatory Visit: Payer: Self-pay

## 2023-09-08 ENCOUNTER — Encounter: Payer: Self-pay | Admitting: Family Medicine

## 2023-09-08 VITALS — Temp 97.4°F

## 2023-09-08 DIAGNOSIS — F17200 Nicotine dependence, unspecified, uncomplicated: Secondary | ICD-10-CM | POA: Insufficient documentation

## 2023-09-08 DIAGNOSIS — Z113 Encounter for screening for infections with a predominantly sexual mode of transmission: Secondary | ICD-10-CM

## 2023-09-08 DIAGNOSIS — F419 Anxiety disorder, unspecified: Secondary | ICD-10-CM | POA: Insufficient documentation

## 2023-09-08 DIAGNOSIS — Z1211 Encounter for screening for malignant neoplasm of colon: Secondary | ICD-10-CM | POA: Insufficient documentation

## 2023-09-08 HISTORY — DX: Encounter for screening for infections with a predominantly sexual mode of transmission: Z11.3

## 2023-09-08 LAB — WET PREP FOR TRICH, YEAST, CLUE
Trichomonas Exam: NEGATIVE
Yeast Exam: NEGATIVE

## 2023-09-08 LAB — HM HIV SCREENING LAB: HM HIV Screening: NEGATIVE

## 2023-09-08 NOTE — Progress Notes (Signed)
 Pt is here for STD testing.  Wet mount results reviewed, no treatment required per SO.  Condoms declined.  Berdie Ogren, RN

## 2023-09-08 NOTE — Patient Instructions (Signed)
 STI screening - Today we obtained a vaginal swab to screen for gonorrhea, chlamydia, and trichomonas - We also obtained a blood sample to screen for HIV and syphilis - If the results are normal, I will send you a letter or MyChart message. If the results are abnormal, I will give you a call.    Estimated time frame for results collected at the [facility] Department: Same day Trichomonas Yeast BV (bacterial vaginosis)  Within 2 weeks Gonorrhea Chlamydia  Within 3-4 weeks HIV Syphilis Hepatitis B Hepatitis C    Smoking cessation resources Quit Smoking Hotline:  800-QUIT-NOW (409-811-9147)

## 2023-09-08 NOTE — Assessment & Plan Note (Signed)
 Not interested in quitting. 1/2 PPD on average since age of 48 years old. Provided quitline resources in [facility].

## 2023-09-08 NOTE — Progress Notes (Signed)
 [facility] Department STI clinic [address] [place] [address] Main phone: [phone]  STI screening visit  Subjective:  Frances Adams is a 48 y.o. female being seen today for an STI screening visit. The patient reports they do have symptoms.  Patient reports that they do not desire a pregnancy in the next year.   They reported they interested in discussing contraception today - s/p total hysterectomy in early 2000s.  No LMP recorded. Patient has had a hysterectomy.  Patient has the following medical conditions:   Patient Active Problem List   Diagnosis Date Noted   Anxiety 09/08/2023   Screening examination for venereal disease 09/08/2023   Smoker 09/08/2023   Traumatic vaginal laceration 07/25/2019   History of breast cancer 08/09/2016   Chief Complaint  Patient presents with   SEXUALLY TRANSMITTED DISEASE    Vaginal discharge and odor x 2 months   HPI  Patient reports clear vaginal odorous discharge for a couple months. Intermittent in nature. Has improved gradually in onset.   Some stomach pain, but this was present from before the vaginal discharge. Denies sick symptoms. No rashes.   No known STI contacts.   Smokes, not interested in quitting Since 48 yo 1/2 PPD now  Does the patient using douching products? No  Last HIV test per patient/review of record was No results found for: HMHIVSCREEN No results found for: HIV   Last HEPC test per patient/review of record was No results found for: HMHEPCSCREEN No components found for: HEPC   Last HEPB test per patient/review of record was No components found for: HMHEPBSCREEN No components found for: HEPC   Patient reports last pap was: None on file s/p total hysterectomy in early 2000s No results found for: DIAGPAP, HPVHIGH, ADEQPAP No results found for: SPECADGYN No Cervical Cancer Screening results to display.  Screening for MPX risk: Does the  patient have an unexplained rash? No Is the patient MSM? No Does the patient endorse multiple sex partners or anonymous sex partners? No Did the patient have close or sexual contact with a person diagnosed with MPX? No Has the patient traveled outside the [place]  where MPX is endemic? No Is there a high clinical suspicion for MPX-- evidenced by one of the following No  -Unlikely to be chickenpox  -Lymphadenopathy  -Rash that present in same phase of evolution on any given body part See flowsheet for further details and programmatic requirements.   Immunization history:  Immunization History  Administered Date(s) Administered   Tdap 05/13/2019    The following portions of the patient's history were reviewed and updated as appropriate: allergies, current medications, past medical history, past social history, past surgical history and problem list.  Objective:   Vitals:   09/08/23 1424  Temp: (!) 97.4 F (36.3 C)   Physical Exam Vitals and nursing note reviewed. Exam conducted with a chaperone present Anner Edison, RN present).  Constitutional:      Appearance: Normal appearance.  HENT:     Head: Normocephalic and atraumatic.     Mouth/Throat:     Mouth: Mucous membranes are moist.     Pharynx: Oropharynx is clear. No oropharyngeal exudate or posterior oropharyngeal erythema.  Eyes:     General: No scleral icterus.       Right eye: No discharge.        Left eye: No discharge.     Conjunctiva/sclera: Conjunctivae normal.  Cardiovascular:     Rate and Rhythm:  Normal rate and regular rhythm.     Pulses: Normal pulses.  Pulmonary:     Effort: Pulmonary effort is normal.  Abdominal:     General: Abdomen is flat.     Palpations: There is no mass.     Tenderness: There is no abdominal tenderness. There is no rebound.  Genitourinary:    General: Normal vulva.     Exam position: Lithotomy position.     Pubic Area: No rash or pubic lice.      Labia:        Right: No rash or  lesion.        Left: No rash or lesion.      Vagina: Normal. No vaginal discharge, erythema, bleeding or lesions.     Cervix: No cervical motion tenderness, discharge, friability, lesion or erythema.     Uterus: Normal.      Rectum: Normal.     Comments: pH = 7 Musculoskeletal:     Cervical back: Neck supple.  Lymphadenopathy:     Head:     Right side of head: No preauricular or posterior auricular adenopathy.     Left side of head: No preauricular or posterior auricular adenopathy.     Cervical: Cervical adenopathy (right supraclavicular nodule) present.     Upper Body:     Right upper body: No supraclavicular, axillary or epitrochlear adenopathy.     Left upper body: No supraclavicular, axillary or epitrochlear adenopathy.     Lower Body: No right inguinal adenopathy. No left inguinal adenopathy.  Skin:    General: Skin is warm and dry.     Findings: No rash.  Neurological:     Mental Status: She is alert and oriented to person, place, and time.  Psychiatric:        Mood and Affect: Mood normal.        Behavior: Behavior normal.    Assessment and Plan:  Frances Adams is a 48 y.o. female presenting to the [facility] Department for STI screening  Screening examination for venereal disease Assessment & Plan: Screening for G/C, HIV and syphilis. Vaginal and oropharyngeal swabs done.   Orders: -     HIV [facility] -     Syphilis Serology, [facility] -     Chlamydia/Gonorrhea [facility] -     Gonococcus culture -     WET PREP FOR TRICH, YEAST, CLUE  Anxiety Assessment & Plan: Amenable to counseling referral. Placed today.   Orders: -     Ambulatory referral to Behavioral Health  Smoker Assessment & Plan: Not interested in quitting. 1/2 PPD on average since age of 48 years old. Provided quitline resources in [facility].    Patient accepted all screenings including oral, vaginal CT/GC and bloodwork for HIV/RPR, and wet prep. Patient meets criteria  for HepB screening? No. Ordered? no Patient meets criteria for HepC screening? No. Ordered? no  Treat wet prep per standing order Discussed time line for [facility] results and that patient will be called with positive results and encouraged patient to call if she had not heard in 2 weeks.  Counseled to return or seek care for continued or worsening symptoms Recommended repeat testing in 3 months with positive results. Recommended condom use with all sex for STI prevention.   Patient is currently using  total hysterectomy since 2001  to prevent pregnancy.    No follow-ups on file.  Future Appointments  Date Time Provider Department  Center  09/23/2023 11:20 AM Vincente Shivers, NP [facility]   Betsey CHRISTELLA Helling, MD

## 2023-09-08 NOTE — Assessment & Plan Note (Signed)
 Screening for G/C, HIV and syphilis. Vaginal and oropharyngeal swabs done.

## 2023-09-08 NOTE — Assessment & Plan Note (Signed)
 Amenable to counseling referral. Placed today.

## 2023-09-08 NOTE — Addendum Note (Signed)
 Addended by: Valetta Close on: 09/08/2023 03:51 PM   Modules accepted: Level of Service

## 2023-09-13 ENCOUNTER — Other Ambulatory Visit: Payer: Self-pay

## 2023-09-13 ENCOUNTER — Encounter: Payer: Self-pay | Admitting: Emergency Medicine

## 2023-09-13 ENCOUNTER — Emergency Department
Admission: EM | Admit: 2023-09-13 | Discharge: 2023-09-13 | Disposition: A | Discharge status: Home / Self Care | Payer: Medicaid Other | Attending: Emergency Medicine | Admitting: Emergency Medicine

## 2023-09-13 DIAGNOSIS — Z853 Personal history of malignant neoplasm of breast: Secondary | ICD-10-CM | POA: Insufficient documentation

## 2023-09-13 DIAGNOSIS — R1030 Lower abdominal pain, unspecified: Secondary | ICD-10-CM | POA: Insufficient documentation

## 2023-09-13 DIAGNOSIS — R945 Abnormal results of liver function studies: Secondary | ICD-10-CM | POA: Diagnosis not present

## 2023-09-13 DIAGNOSIS — R109 Unspecified abdominal pain: Secondary | ICD-10-CM

## 2023-09-13 LAB — COMPREHENSIVE METABOLIC PANEL
ALT: 15 U/L (ref 0–44)
AST: 21 U/L (ref 15–41)
Albumin: 4.3 g/dL (ref 3.5–5.0)
Alkaline Phosphatase: 63 U/L (ref 38–126)
Anion gap: 11 (ref 5–15)
BUN: 16 mg/dL (ref 6–20)
CO2: 25 mmol/L (ref 22–32)
Calcium: 9 mg/dL (ref 8.9–10.3)
Chloride: 103 mmol/L (ref 98–111)
Creatinine, Ser: 0.8 mg/dL (ref 0.44–1.00)
GFR, Estimated: >60 mL/min (ref >60)
Glucose, Bld: 87 mg/dL (ref 70–99)
Potassium: 4.8 mmol/L (ref 3.5–5.1)
Sodium: 139 mmol/L (ref 135–145)
Total Bilirubin: 0.7 mg/dL (ref 0.0–1.2)
Total Protein: 7.2 g/dL (ref 6.5–8.1)

## 2023-09-13 LAB — URINALYSIS, ROUTINE W REFLEX MICROSCOPIC
Bilirubin Urine: NEGATIVE
Glucose, UA: NEGATIVE mg/dL
Ketones, ur: NEGATIVE mg/dL
Nitrite: NEGATIVE
Protein, ur: NEGATIVE mg/dL
Specific Gravity, Urine: 1.019 (ref 1.005–1.030)
pH: 8 (ref 5.0–8.0)

## 2023-09-13 LAB — CBC
HCT: 40.5 % (ref 36.0–46.0)
Hemoglobin: 13.5 g/dL (ref 12.0–15.0)
MCH: 33.1 pg (ref 26.0–34.0)
MCHC: 33.3 g/dL (ref 30.0–36.0)
MCV: 99.3 fL (ref 80.0–100.0)
Platelets: 287 10*3/uL (ref 150–400)
RBC: 4.08 MIL/uL (ref 3.87–5.11)
RDW: 14 % (ref 11.5–15.5)
WBC: 7 10*3/uL (ref 4.0–10.5)
nRBC: 0 % (ref 0.0–0.2)

## 2023-09-13 LAB — LIPASE, BLOOD: Lipase: 27 U/L (ref 11–51)

## 2023-09-13 MED ORDER — DICYCLOMINE HCL 10 MG PO CAPS: 10.0000 mg | ORAL_CAPSULE | Freq: Three times a day (TID) | ORAL | 1 refills | Status: DC | PRN

## 2023-09-13 NOTE — ED Triage Notes (Signed)
 C/O abdominal pain x 1 year, worse over past week.  C/O lower abdominal pain.  Denies N/V.  Denies dysuria.  Seen by [facility] Department for same and vaginal discharge 09/08/23 all tests negative.

## 2023-09-13 NOTE — ED Provider Notes (Signed)
 [facility] Provider Note    Event Date/Time   First MD Initiated Contact with Patient 09/13/23 1519     (approximate)  History   Chief Complaint: Abdominal Pain  HPI  Frances Adams is a 48 y.o. female with a past medical history of breast cancer status postchemotherapy and radiation who presents to the emergency department for 1 year of lower abdominal pain.  According to the patient for the past 1+ year she has been experiencing intermittent pain in the lower abdomen.  Patient states it comes and goes however over the last for 5 days it has been present more often than not.  Denies any nausea vomiting or diarrhea.  No urinary symptoms.  States she has had a slight amount of vaginal discharge which is not too atypical.  States she followed up the health department had a pelvic exam done with no findings.  Patient denies any fever.  Physical Exam   Triage Vital Signs: ED Triage Vitals  Encounter Vitals Group     BP 09/13/23 1030 120/74     Systolic BP Percentile --      Diastolic BP Percentile --      Pulse Rate 09/13/23 1030 72     Resp 09/13/23 1030 16     Temp 09/13/23 1030 97.6 F (36.4 C)     Temp Source 09/13/23 1030 Oral     SpO2 09/13/23 1030 100 %     Weight 09/13/23 1025 119 lb 14.9 oz (54.4 kg)     Height --      Head Circumference --      Peak Flow --      Pain Score 09/13/23 1025 5     Pain Loc --      Pain Education --      Exclude from Growth Chart --     Most recent vital signs: Vitals:   09/13/23 1030 09/13/23 1518  BP: 120/74 115/76  Pulse: 72 65  Resp: 16 16  Temp: 97.6 F (36.4 C) 97.7 F (36.5 C)  SpO2: 100% 100%    General: Awake, no distress.  CV:  Good peripheral perfusion.  Regular rate and rhythm  Resp:  Normal effort.  Equal breath sounds bilaterally.  Abd:  No distention.  Soft, largely benign abdomen very slight suprapubic tenderness.  ED Results / Procedures / Treatments   MEDICATIONS ORDERED IN  ED: Medications - No data to display   IMPRESSION / MDM / ASSESSMENT AND PLAN / ED COURSE  I reviewed the triage vital signs and the nursing notes.  Patient's presentation is most consistent with acute presentation with potential threat to life or bodily function.  Patient presents to the emergency department with complaints of 1+ years of lower abdominal discomfort which comes and goes although she believes has been more frequent over the past 5 days or so.  Overall patient appears well, reassuring vital signs.  Patient denies any fever at home.  No nausea vomiting diarrhea or urinary symptoms.  Patient's urinalysis does not appear to show any urinary tract infection.  CBC is reassuring chemistry is reassuring including normal LFTs, normal lipase.  Given 1 year of symptoms I did offer to proceed with CT imaging versus following up with GI medicine for further evaluation.  Patient states she would prefer to follow-up with GI medicine which I believe is reasonable.  Will prescribe Bentyl  to see if this helps with the patient's abdominal symptoms to be taken if  needed.  I discussed return precautions for any worsening pain.  Patient also states as a secondary complaint she has noticed a lymph node in the right side of her neck.  On palpation patient has a very small barely palpable anterior cervical lymph node, however given the patient's history of breast cancer I discussed with the patient to follow-up with oncology for further evaluation such as a PET/CT or biopsy if deemed necessary.  Patient is agreeable to this plan as well.  FINAL CLINICAL IMPRESSION(S) / ED DIAGNOSES   Abdominal pain  Rx / DC Orders   Bentyl   Note:  This document was prepared using Dragon voice recognition software and may include unintentional dictation errors.   Dorothyann Drivers, MD 09/13/23 1558

## 2023-09-14 LAB — GONOCOCCUS CULTURE

## 2023-09-16 ENCOUNTER — Inpatient Hospital Stay: Payer: Medicaid Other

## 2023-09-16 ENCOUNTER — Encounter: Payer: Self-pay | Admitting: Oncology

## 2023-09-16 ENCOUNTER — Inpatient Hospital Stay: Payer: Medicaid Other | Attending: Oncology | Admitting: Oncology

## 2023-09-16 VITALS — BP 115/83 | HR 83 | Temp 98.4°F | Resp 16 | Ht 62.0 in | Wt 133.7 lb

## 2023-09-16 DIAGNOSIS — Z803 Family history of malignant neoplasm of breast: Secondary | ICD-10-CM | POA: Diagnosis not present

## 2023-09-16 DIAGNOSIS — R590 Localized enlarged lymph nodes: Secondary | ICD-10-CM | POA: Insufficient documentation

## 2023-09-16 DIAGNOSIS — Z9221 Personal history of antineoplastic chemotherapy: Secondary | ICD-10-CM | POA: Insufficient documentation

## 2023-09-16 DIAGNOSIS — Z853 Personal history of malignant neoplasm of breast: Secondary | ICD-10-CM | POA: Insufficient documentation

## 2023-09-16 DIAGNOSIS — R599 Enlarged lymph nodes, unspecified: Secondary | ICD-10-CM

## 2023-09-16 DIAGNOSIS — Z923 Personal history of irradiation: Secondary | ICD-10-CM | POA: Diagnosis not present

## 2023-09-16 DIAGNOSIS — Z1231 Encounter for screening mammogram for malignant neoplasm of breast: Secondary | ICD-10-CM

## 2023-09-16 NOTE — Progress Notes (Signed)
[facility]  [phone] [phone]  ID: Frances Adams OB: July 05, 1976  MR#: [id number]  AOZ#:[id number]  Patient Care Team: Patient, No Pcp Per as PCP - General (General Practice) Frances Ruths, MD as Consulting Physician (Oncology)  CHIEF COMPLAINT: History of breast cancer, supraclavicular lymphadenopathy.  INTERVAL HISTORY: Patient is a 48 year old female with a distant history of bilateral breast cancer.  She has not been seen in clinic in over 8 years.  She is referred back from the ED for possible right supraclavicular lymphadenopathy.  She has intermittent abdominal pain with no obvious etiology and a referral was made to GI by the ED physician.  She otherwise feels well.  She has no neurologic complaints.  She denies any recent fevers or illnesses.  She has a good appetite and denies weight loss.  She has no chest pain, shortness of breath, cough, or hemoptysis.  She denies any nausea, vomiting, constipation, or diarrhea.  She has no urinary complaints.  Patient offers no further specific complaints today.  REVIEW OF SYSTEMS:   Review of Systems  Constitutional: Negative.  Negative for fever, malaise/fatigue and weight loss.  Respiratory: Negative.  Negative for cough, hemoptysis and shortness of breath.   Cardiovascular: Negative.  Negative for chest pain and leg swelling.  Gastrointestinal:  Positive for abdominal pain. Negative for blood in stool, constipation, diarrhea, melena, nausea and vomiting.  Genitourinary: Negative.  Negative for dysuria.  Musculoskeletal: Negative.  Negative for back pain.  Skin: Negative.  Negative for rash.  Neurological: Negative.  Negative for dizziness, focal weakness, weakness and headaches.  Psychiatric/Behavioral:  The patient does not have insomnia.     As per HPI. Otherwise, a complete review of systems is negative.  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, triple negative   Cancer (HCC)    RT BREAST   Depression    Personal history of chemotherapy 2004   Personal history of radiation therapy 2004   Personal history of radiation therapy 2008   mammosite    PAST SURGICAL HISTORY: Past Surgical History:  Procedure Laterality Date   ABDOMINAL HYSTERECTOMY     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   BREAST EXCISIONAL BIOPSY Right 2007   calcs, benign   BREAST LUMPECTOMY Right 2004   breast ca   BREAST SURGERY     I & D EXTREMITY Right 05/13/2019   Procedure: IRRIGATION AND DEBRIDEMENT MIDDLE FINGER;  Surgeon: Knute Neu, MD;  Location: [facility] OR;  Service: Plastics;  Laterality: Right;   PERCUTANEOUS PINNING Right 05/13/2019   Procedure: PERCUTANEOUS PINNING;  Surgeon: Knute Neu, MD;  Location: [facility] OR;  Service: Plastics;  Laterality: Right;   PERINEAL LACERATION REPAIR N/A 07/25/2019   Procedure: REPAIR OF VAGINAL LACERATION;  Surgeon: Conard Novak, MD;  Location: [facility] ORS;  Service: Gynecology;  Laterality: N/A;    FAMILY HISTORY: Family History  Problem Relation Age of Onset   Breast cancer Cousin        3's    ADVANCED DIRECTIVES (Y/N):  N  HEALTH MAINTENANCE: Social History   Tobacco Use   Smoking status: Every Day    Current packs/day: 0.50    Average packs/day: 0.5 packs/day for 34.5 years (17.2 ttl pk-yrs)    Types:  Cigarettes    Start date: 03/24/1989   Smokeless tobacco: Never  Vaping Use   Vaping status: Never Used  Substance Use Topics   Alcohol use: Yes   Drug use: No     Colonoscopy:  PAP:  Bone density:  Lipid panel:  No Known Allergies  Current Outpatient Medications  Medication Sig Dispense Refill   dicyclomine (BENTYL) 10 MG capsule Take 1 capsule (10 mg total) by mouth 3 (three) times daily as needed for spasms. (Patient  not taking: Reported on 09/16/2023) 30 capsule 1   estradiol (ESTRACE) 0.1 MG/GM vaginal cream Place 1 Applicatorful vaginally 2 (two) times a week. Place at bedtime (Patient not taking: Reported on 11/16/2019) 42.5 g 3   No current facility-administered medications for this visit.    OBJECTIVE: Vitals:   09/16/23 1458  BP: 115/83  Pulse: 83  Resp: 16  Temp: 98.4 F (36.9 C)  SpO2: 100%     Body mass index is 24.45 kg/m.    ECOG FS:0 - Asymptomatic  General: Well-developed, well-nourished, no acute distress. Eyes: Pink conjunctiva, anicteric sclera. HEENT: Normocephalic, moist mucous membranes. Lungs: No audible wheezing or coughing. Heart: Regular rate and rhythm. Abdomen: Soft, nontender, no obvious distention. Musculoskeletal: No edema, cyanosis, or clubbing. Neuro: Alert, answering all questions appropriately. Cranial nerves grossly intact. Skin: No rashes or petechiae noted. Psych: Normal affect. Lymphatics: No cervical, calvicular, axillary or inguinal LAD.   LAB RESULTS:  Lab Results  Component Value Date   NA 139 09/13/2023   K 4.8 09/13/2023   CL 103 09/13/2023   CO2 25 09/13/2023   GLUCOSE 87 09/13/2023   BUN 16 09/13/2023   CREATININE 0.80 09/13/2023   CALCIUM 9.0 09/13/2023   PROT 7.2 09/13/2023   ALBUMIN 4.3 09/13/2023   AST 21 09/13/2023   ALT 15 09/13/2023   ALKPHOS 63 09/13/2023   BILITOT 0.7 09/13/2023   GFRNONAA >60 09/13/2023   GFRAA >60 11/14/2019    Lab Results  Component Value Date   WBC 7.0 09/13/2023   NEUTROABS 1.4 (L) 07/25/2019   HGB 13.5 09/13/2023   HCT 40.5 09/13/2023   MCV 99.3 09/13/2023   PLT 287 09/13/2023     STUDIES: No results found.  ASSESSMENT: History of breast cancer, supraclavicular lymphadenopathy.  PLAN:    History of breast cancer: Patient was initially diagnosed with right sided breast cancer in 2004 and underwent lumpectomy with axillary node dissection, chemotherapy, and XRT.  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  Genetic testing was reported as BRCA 1 and 2 negative.  Patient's last mammogram was on October 04, 2018 and was reported as BI-RADS 2.  Will get repeat mammogram in the next 1 to 2 weeks. Right supraclavicular lymphadenopathy: Minimal.  Will get ultrasound to further evaluate. Disposition: No follow-up has been scheduled at this time.  Will arrange follow-up if any abnormalities noted on imaging.  I spent a total of 45 minutes reviewing chart data, face-to-face evaluation with the patient, counseling and coordination of care as detailed above.   Patient expressed understanding and was in agreement with this plan. She also understands that She can call clinic at any time with any questions, concerns, or complaints.    Cancer Staging  No matching staging information was found for the patient.   Frances Ruths, MD   09/16/2023 3:38 PM

## 2023-09-16 NOTE — Progress Notes (Signed)
States that she has swollen lymph nodes in her neck that she noticed a couple of weeks ago. Also mention some discomfort in right breast area where she previously had radiation that started within the last couple of days.

## 2023-09-23 ENCOUNTER — Encounter: Payer: Self-pay | Admitting: General Practice

## 2023-09-23 ENCOUNTER — Ambulatory Visit (INDEPENDENT_AMBULATORY_CARE_PROVIDER_SITE_OTHER): Payer: Medicaid Other | Admitting: General Practice

## 2023-09-23 ENCOUNTER — Ambulatory Visit
Admission: RE | Admit: 2023-09-23 | Discharge: 2023-09-23 | Disposition: A | Discharge status: Home / Self Care | Payer: Medicaid Other | Source: Ambulatory Visit | Attending: Oncology | Admitting: Oncology

## 2023-09-23 ENCOUNTER — Other Ambulatory Visit: Payer: Self-pay | Admitting: General Practice

## 2023-09-23 VITALS — BP 110/62 | HR 62 | Temp 98.5°F | Ht 63.5 in | Wt 134.0 lb

## 2023-09-23 DIAGNOSIS — E039 Hypothyroidism, unspecified: Secondary | ICD-10-CM

## 2023-09-23 DIAGNOSIS — Z1159 Encounter for screening for other viral diseases: Secondary | ICD-10-CM

## 2023-09-23 DIAGNOSIS — R599 Enlarged lymph nodes, unspecified: Secondary | ICD-10-CM | POA: Insufficient documentation

## 2023-09-23 DIAGNOSIS — C50911 Malignant neoplasm of unspecified site of right female breast: Secondary | ICD-10-CM

## 2023-09-23 DIAGNOSIS — Z7689 Persons encountering health services in other specified circumstances: Secondary | ICD-10-CM | POA: Insufficient documentation

## 2023-09-23 DIAGNOSIS — F17200 Nicotine dependence, unspecified, uncomplicated: Secondary | ICD-10-CM | POA: Diagnosis not present

## 2023-09-23 DIAGNOSIS — Z853 Personal history of malignant neoplasm of breast: Secondary | ICD-10-CM | POA: Insufficient documentation

## 2023-09-23 DIAGNOSIS — R109 Unspecified abdominal pain: Secondary | ICD-10-CM | POA: Insufficient documentation

## 2023-09-23 DIAGNOSIS — R590 Localized enlarged lymph nodes: Secondary | ICD-10-CM

## 2023-09-23 DIAGNOSIS — R1030 Lower abdominal pain, unspecified: Secondary | ICD-10-CM

## 2023-09-23 LAB — TSH: TSH: 6.22 u[IU]/mL — ABNORMAL HIGH (ref 0.35–5.50)

## 2023-09-23 MED ORDER — LEVOTHYROXINE SODIUM 25 MCG PO TABS: 25.0000 ug | ORAL_TABLET | Freq: Every day | ORAL | 0 refills | Status: AC

## 2023-09-23 NOTE — Assessment & Plan Note (Signed)
Not on medication.  States she was on it years ago.   Repeat TSH pending.

## 2023-09-23 NOTE — Assessment & Plan Note (Signed)
Not interested in quitting.   Smoking cessation instruction/counseling given:  counseled patient on the dangers of tobacco use, advised patient to stop smoking, and reviewed strategies to maximize success

## 2023-09-23 NOTE — Assessment & Plan Note (Signed)
Present on exam today.   Seen by oncologist yesterday. Reviewed notes.  Scheduled for [place] today.   Will continue to monitor.

## 2023-09-23 NOTE — Assessment & Plan Note (Signed)
In remission.   Following with oncology.  Due for mammogram. Order was placed by oncologist. She will get it scheduled.

## 2023-09-23 NOTE — Assessment & Plan Note (Addendum)
Chronic. Recently seen at [facility]. Reviewed notes, labs.   Was given dicyclomine but she did not go get it after she read the side effects.   She said that she will try it. F/u in 2 weeks.  She has an appt with GI on 10/21/23.
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
## 2023-09-23 NOTE — Assessment & Plan Note (Signed)
EMR reviewed briefly.

## 2023-09-23 NOTE — Patient Instructions (Addendum)
Stop by the lab prior to leaving today. I will notify you of your results once received.   Start dicyclomine.   Schedule mammogram.   Follow up in 2 weeks.

## 2023-09-24 LAB — HEPATITIS C ANTIBODY: Hepatitis C Ab: NONREACTIVE

## 2023-10-04 ENCOUNTER — Telehealth: Payer: Self-pay | Admitting: *Deleted

## 2023-10-04 NOTE — Telephone Encounter (Signed)
Message received from Dr. Orlie Dakin regarding [place] results. There is no lymphadenopathy identified, no area to biopsy. Patient has mammogram scheduled later this week, 2/5. If mammogram is negative, patient will not need follow up here at the cancer center. Patient verbalized understanding of results and is in agreement with plan.

## 2023-10-06 ENCOUNTER — Ambulatory Visit
Admission: RE | Admit: 2023-10-06 | Discharge: 2023-10-06 | Disposition: A | Discharge status: Home / Self Care | Payer: Medicaid Other | Source: Ambulatory Visit | Attending: Oncology | Admitting: Oncology

## 2023-10-06 DIAGNOSIS — Z853 Personal history of malignant neoplasm of breast: Secondary | ICD-10-CM | POA: Diagnosis not present

## 2023-10-06 DIAGNOSIS — Z1231 Encounter for screening mammogram for malignant neoplasm of breast: Secondary | ICD-10-CM | POA: Insufficient documentation

## 2023-10-12 ENCOUNTER — Encounter: Payer: Self-pay | Admitting: General Practice

## 2023-10-12 ENCOUNTER — Encounter: Payer: Self-pay | Admitting: *Deleted

## 2023-10-12 ENCOUNTER — Ambulatory Visit (INDEPENDENT_AMBULATORY_CARE_PROVIDER_SITE_OTHER): Payer: Medicaid Other | Admitting: General Practice

## 2023-10-12 VITALS — BP 110/72 | HR 82 | Temp 98.1°F | Ht 63.5 in | Wt 134.0 lb

## 2023-10-12 DIAGNOSIS — F419 Anxiety disorder, unspecified: Secondary | ICD-10-CM

## 2023-10-12 DIAGNOSIS — R590 Localized enlarged lymph nodes: Secondary | ICD-10-CM

## 2023-10-12 DIAGNOSIS — F322 Major depressive disorder, single episode, severe without psychotic features: Secondary | ICD-10-CM | POA: Insufficient documentation

## 2023-10-12 DIAGNOSIS — F1010 Alcohol abuse, uncomplicated: Secondary | ICD-10-CM

## 2023-10-12 DIAGNOSIS — F17200 Nicotine dependence, unspecified, uncomplicated: Secondary | ICD-10-CM

## 2023-10-12 DIAGNOSIS — Z853 Personal history of malignant neoplasm of breast: Secondary | ICD-10-CM

## 2023-10-12 DIAGNOSIS — E039 Hypothyroidism, unspecified: Secondary | ICD-10-CM

## 2023-10-12 MED ORDER — HYDROXYZINE PAMOATE 50 MG PO CAPS: 50.0000 mg | ORAL_CAPSULE | Freq: Two times a day (BID) | ORAL | 0 refills | Status: DC | PRN

## 2023-10-12 NOTE — Patient Instructions (Addendum)
Start hydroxyzine 50 mg twice a day as needed.  Referral placed for addiction clinic.   Continue levothyroxine.   Follow up in 4-6 weeks.

## 2023-10-12 NOTE — Assessment & Plan Note (Signed)
Follows oncology.  Uptodate on her mammogram.

## 2023-10-12 NOTE — Progress Notes (Signed)
Established Patient Office Visit  Subjective   Patient ID: Frances Adams, female    DOB: [birth year]  Age: 48 y.o. MRN: [id number]  Chief Complaint  Patient presents with   Depression    Patient here today for follow up on depression. Patient feels like depression is about the same as LOV.     Depression        Associated symptoms include no headaches and no suicidal ideas.   Frances Adams is a 48 year old female with past medical history of hypothyroidism, hx of breast cancer presents today to discuss depression.   Depression/anxiety: Diagnosed several years ago. She was treated in 2006 after her first husband deceased. She does not remember what she was treated with but it was either xanax or valium. She has mind racing thoughts all the time. She does not have a plan. She has never been in therapy. She has been prescribed Zoloft in the past but stopped it right away due to increased suicidal thoughts. She denies SI/HI.   Lower abdominal pain: still there. Dull. She did pick up the bentyl but she did not take it. She is scheduled to see the GI doctor on 10/21/23.   Hypothyroidism - recently found to have elevated TSH levels. Was started on levothyroxine 25 mg once daily. taking her thyroid mediation once daily with water. No concerns.   Smoking/Alcohol abuse- drinks daily. 2-3 beer daily. Would like to quit but hasn't been able to. Does not want to try medication. Ok with referral to addiction clinic. Smokes anywhere from 1/2 to 1 pack a day. She would like help with quitting.   Patient Active Problem List   Diagnosis Date Noted   Severe major depressive disorder (HCC) 10/12/2023   Alcohol abuse 10/12/2023   Malignant neoplasm of right female breast (HCC) 09/23/2023   Hypothyroidism 09/23/2023   Abdominal pain 09/23/2023   Lymphadenopathy, supraclavicular 09/23/2023   Anxiety 09/08/2023   Smoker 09/08/2023   History of breast cancer 08/09/2016   Past Medical  History:  Diagnosis Date   Asthma    Breast cancer (HCC) 2004   right breast cancer - chemotherapy and radiation   Breast cancer (HCC) 2008   right breast cancer - mammo site radiation, triple negative   Cancer (HCC)    RT BREAST   Depression    Personal history of chemotherapy 2004   Personal history of radiation therapy 2004   Personal history of radiation therapy 2008   mammosite   Screening examination for venereal disease 09/08/2023   Allergies  Allergen Reactions   Other     Pt states she has an allergy to IV pain medication - unsure the medicine         10/12/2023   12:28 PM 09/16/2023    3:22 PM  Depression screen PHQ 2/9  Decreased Interest 1 3  Down, Depressed, Hopeless 3 3  PHQ - 2 Score 4 6  Altered sleeping 3   Tired, decreased energy 3   Change in appetite 3   Feeling bad or failure about yourself  3   Trouble concentrating 3   Moving slowly or fidgety/restless 2   Suicidal thoughts 2   PHQ-9 Score 23   Difficult doing work/chores Somewhat difficult        10/12/2023   12:28 PM  GAD 7 : Generalized Anxiety Score  Nervous, Anxious, on Edge 3  Control/stop worrying 3  Worry too much - different things 3  Trouble relaxing 3  Restless 3  Easily annoyed or irritable 3  Afraid - awful might happen 3  Total GAD 7 Score 21  Anxiety Difficulty Somewhat difficult      Review of Systems  Constitutional:  Negative for chills and fever.  Respiratory:  Negative for shortness of breath.   Cardiovascular:  Negative for chest pain.  Gastrointestinal:  Negative for abdominal pain, constipation, diarrhea, heartburn, nausea and vomiting.  Genitourinary:  Negative for dysuria, frequency and urgency.  Neurological:  Negative for dizziness and headaches.  Endo/Heme/Allergies:  Negative for polydipsia.  Psychiatric/Behavioral:  Positive for depression. Negative for suicidal ideas. The patient is nervous/anxious.       Objective:     BP 110/72 (BP Location:  Left Arm, Patient Position: Sitting, Cuff Size: Normal)   Pulse 82   Temp 98.1 F (36.7 C) (Oral)   Ht 5' 3.5" (1.613 m)   Wt 134 lb (60.8 kg)   SpO2 98%   BMI 23.36 kg/m  BP Readings from Last 3 Encounters:  10/12/23 110/72  09/23/23 110/62  09/16/23 115/83   Wt Readings from Last 3 Encounters:  10/12/23 134 lb (60.8 kg)  09/23/23 134 lb (60.8 kg)  09/16/23 133 lb 11.2 oz (60.6 kg)      Physical Exam Vitals and nursing note reviewed.  Constitutional:      Appearance: Normal appearance.  Cardiovascular:     Rate and Rhythm: Normal rate and regular rhythm.     Pulses: Normal pulses.     Heart sounds: Normal heart sounds.  Pulmonary:     Effort: Pulmonary effort is normal.     Breath sounds: Normal breath sounds.  Neurological:     Mental Status: She is alert and oriented to person, place, and time.  Psychiatric:        Mood and Affect: Mood normal.        Behavior: Behavior normal.        Thought Content: Thought content normal.        Judgment: Judgment normal.      No results found for any visits on 10/12/23.     The ASCVD Risk score (Arnett DK, et al., 2019) failed to calculate for the following reasons:   Cannot find a previous HDL lab   Cannot find a previous total cholesterol lab    Assessment & Plan:  Anxiety Assessment & Plan: Uncontrolled.  Declines therapy referral.  Discussed many treatment options.  Agreeable to start hydroxyzine 50 mg twice daily as needed. Follow up in 5 weeks.     10/12/2023   12:28 PM  GAD 7 : Generalized Anxiety Score  Nervous, Anxious, on Edge 3  Control/stop worrying 3  Worry too much - different things 3  Trouble relaxing 3  Restless 3  Easily annoyed or irritable 3  Afraid - awful might happen 3  Total GAD 7 Score 21  Anxiety Difficulty Somewhat difficult    Orders: -     hydrOXYzine Pamoate; Take 1 capsule (50 mg total) by mouth 2 (two) times daily as needed.  Dispense: 60 capsule; Refill: 0 -      Ambulatory referral to Chemical Dependency  Smoker Assessment & Plan: Smoking cessation instruction/counseling given:  counseled patient on the dangers of tobacco use, advised patient to stop smoking, and reviewed strategies to maximize success   Interested in quitting.  Does not want to try chantix, wellbutrin, patches or gum.  Discussed the cutting back option. She will try it.  Agreeable for referral to addiction clinic.    Alcohol abuse Assessment & Plan: Uncontrolled.  Discussed medications. She declines.   Agreeable for referral to addiction clinic.   Orders: -     Ambulatory referral to Chemical Dependency  Severe major depressive disorder (HCC) Assessment & Plan: Uncontrolled.  Declines therapy referral.  Declines antidepressants.  Discussed starting Wellbutrin as it will help with depression and smoking cessation. She will think about it. Handout given regarding Wellbutrin.  She will update if she changes her mind.  Follow up in 5 weeks.   Lymphadenopathy, supraclavicular Assessment & Plan: Resolved.  Ultrasound negative.   History of breast cancer Assessment & Plan: Follows oncology.  Uptodate on her mammogram.    Hypothyroidism, unspecified type Assessment & Plan: Uncontrolled.  Recently started levothyroxine 25 mg once daily.  She is taking it correctly.   Recheck labs in 5 weeks.      Return in about 5 weeks (around 11/16/2023) for check TSH and anxiey and depression.[place] Charon, NP

## 2023-10-12 NOTE — Assessment & Plan Note (Signed)
Smoking cessation instruction/counseling given:  counseled patient on the dangers of tobacco use, advised patient to stop smoking, and reviewed strategies to maximize success   Interested in quitting.  Does not want to try chantix, wellbutrin, patches or gum.  Discussed the cutting back option. She will try it.   Agreeable for referral to addiction clinic.

## 2023-10-12 NOTE — Assessment & Plan Note (Signed)
Uncontrolled.  Discussed medications. She declines.   Agreeable for referral to addiction clinic.

## 2023-10-12 NOTE — Assessment & Plan Note (Signed)
Uncontrolled.  Declines therapy referral.  Declines antidepressants.  Discussed starting Wellbutrin as it will help with depression and smoking cessation. She will think about it. Handout given regarding Wellbutrin.  She will update if she changes her mind.  Follow up in 5 weeks.

## 2023-10-12 NOTE — Assessment & Plan Note (Addendum)
Uncontrolled.  Declines therapy referral.  Discussed many treatment options.  Agreeable to start hydroxyzine 50 mg twice daily as needed. Follow up in 5 weeks.     10/12/2023   12:28 PM  GAD 7 : Generalized Anxiety Score  Nervous, Anxious, on Edge 3  Control/stop worrying 3  Worry too much - different things 3  Trouble relaxing 3  Restless 3  Easily annoyed or irritable 3  Afraid - awful might happen 3  Total GAD 7 Score 21  Anxiety Difficulty Somewhat difficult

## 2023-10-12 NOTE — Assessment & Plan Note (Signed)
Resolved.  Ultrasound negative.

## 2023-10-12 NOTE — Assessment & Plan Note (Signed)
Uncontrolled.  Recently started levothyroxine 25 mg once daily.  She is taking it correctly.   Recheck labs in 5 weeks.

## 2023-10-21 ENCOUNTER — Ambulatory Visit: Payer: Medicaid Other | Admitting: Physician Assistant

## 2023-10-21 ENCOUNTER — Encounter: Payer: Self-pay | Admitting: Physician Assistant

## 2023-10-21 VITALS — BP 111/76 | HR 76 | Temp 98.7°F | Ht 62.5 in | Wt 134.0 lb

## 2023-10-21 DIAGNOSIS — R1013 Epigastric pain: Secondary | ICD-10-CM

## 2023-10-21 DIAGNOSIS — F1090 Alcohol use, unspecified, uncomplicated: Secondary | ICD-10-CM

## 2023-10-21 DIAGNOSIS — R1310 Dysphagia, unspecified: Secondary | ICD-10-CM | POA: Diagnosis not present

## 2023-10-21 DIAGNOSIS — Z1211 Encounter for screening for malignant neoplasm of colon: Secondary | ICD-10-CM

## 2023-10-21 DIAGNOSIS — K760 Fatty (change of) liver, not elsewhere classified: Secondary | ICD-10-CM | POA: Diagnosis not present

## 2023-10-21 DIAGNOSIS — R1030 Lower abdominal pain, unspecified: Secondary | ICD-10-CM

## 2023-10-21 MED ORDER — PEG 3350-KCL-NA BICARB-NACL 420 G PO SOLR
4000.0000 mL | Freq: Once | ORAL | 0 refills | Status: AC
Start: 2023-10-21 — End: 2023-10-21

## 2023-10-21 NOTE — Progress Notes (Signed)
Celso Amy, PA-C [address]  [address]  [place], [place] [address]  Main: [phone]  Fax: [phone]   Gastroenterology Consultation  Referring Provider:     Modesto Charon, NP Primary Care Physician:  Modesto Charon, NP Primary Gastroenterologist:  Celso Amy, PA-C / Dr. Wyline Mood   Reason for Consultation:     Abdominal Pain        HPI:   Lorana Maffeo is a 48 y.o. y/o female referred for consultation & management  by Modesto Charon, NP.    She went to [facility] ED 09/13/2023 to evaluate intermittent lower abdominal pain for 1 year.  Pain was worsening for 5 days.  No associated nausea, vomiting, diarrhea, or urinary symptoms.  Labs showed normal CBC, CMP, lipase.  Hemoglobin 13.5.  Normal LFTs.  She did not have any abdominal imaging.  Was told to follow-up with GI.  Was prescribed dicyclomine 10 Mg TID prn.  07/2021 CT abdomen pelvis with contrast at [facility], to evaluate RLQ pain, showed no acute abnormality.  Hysterectomy.  Normal appendix and gallbladder.  Diffuse hepatic steatosis.  Abdominal pelvic CT in 2019, to evaluate RLQ pain, showed no acute abnormality.  Previous hysterectomy.  Still has appendix and gallbladder.  Incidental hepatic steatosis.  No previous colonoscopy or GI evaluation.  Medical history significant for breast cancer s/p chemo and radiation.  Symptoms: Patient has bilateral intermittent lower abdominal cramping which comes and goes.  She also has had some epigastric pain with episodes of solid food dysphagia.  Mid lower abdominal cramping has been intermittent for several years.  It is worse with stress.  She did not try dicyclomine which was prescribed.  She denies diarrhea, constipation, rectal bleeding, or weight loss.  No family history of colon cancer.  There is family history of IBS.  She states certain foods like bread and rice get stuck in her mid lower chest and epigastrium.  She avoids these foods.  She also has occasional painful  swallowing.  She does not feel acid reflux.  Admits to drinking 2 to 3 cans of beer daily for many years.   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   BREAST EXCISIONAL BIOPSY Right 2007   calcs, benign   BREAST LUMPECTOMY Right 2004   breast ca   BREAST SURGERY     I & D EXTREMITY Right 05/13/2019   Procedure: IRRIGATION AND DEBRIDEMENT MIDDLE FINGER;  Surgeon: Knute Neu, MD;  Location: [facility] OR;  Service: Plastics;  Laterality: Right;   PERCUTANEOUS PINNING Right 05/13/2019   Procedure: PERCUTANEOUS PINNING;  Surgeon: Knute Neu, MD;  Location: [facility] OR;  Service: Plastics;  Laterality: Right;   PERINEAL LACERATION REPAIR N/A 07/25/2019   Procedure: REPAIR OF VAGINAL LACERATION;  Surgeon: Conard Novak, MD;  Location: [facility] ORS;  Service: Gynecology;  Laterality: N/A;  Prior to Admission medications   Medication Sig Start Date End Date Taking? Authorizing Provider  hydrOXYzine (VISTARIL) 50 MG capsule Take 1 capsule (50 mg total) by mouth 2 (two) times daily as needed. 10/12/23   Modesto Charon, NP  levothyroxine (SYNTHROID) 25 MCG tablet Take 1 tablet (25 mcg total) by mouth daily. 09/23/23   Modesto Charon, NP    Family History  Problem Relation Age of Onset   Cancer Father        lung cancer   Breast cancer Cousin        32's     Social History   Tobacco Use   Smoking status: Every Day    Current packs/day: 0.50     Average packs/day: 0.5 packs/day for 34.6 years (17.3 ttl pk-yrs)    Types: Cigarettes    Start date: 03/24/1989   Smokeless tobacco: Never  Vaping Use   Vaping status: Former  Substance Use Topics   Alcohol use: Yes    Comment: 2-3 daily (mikes hard lemonade)   Drug use: No    Allergies as of 10/21/2023 - Review Complete 10/21/2023  Allergen Reaction Noted   Other  07/28/2021    Review of Systems:    All systems reviewed and negative except where noted in HPI.   Physical Exam:  BP 111/76   Pulse 76   Temp 98.7 F (37.1 C)   Ht 5' 2.5" (1.588 m)   Wt 134 lb (60.8 kg)   BMI 24.12 kg/m  No LMP recorded. Patient has had a hysterectomy.  General:   Alert,  Well-developed, well-nourished, pleasant and cooperative in NAD Lungs:  Respirations even and unlabored.  Clear throughout to auscultation.   No wheezes, crackles, or rhonchi. No acute distress. Heart:  Regular rate and rhythm; no murmurs, clicks, rubs, or gallops. Abdomen:  Normal bowel sounds.  No bruits.  Soft, and non-distended without masses, hepatosplenomegaly or hernias noted.  No Tenderness.  No guarding or rebound tenderness.    Neurologic:  Alert and oriented x3;  grossly normal neurologically. Psych:  Alert and cooperative. Normal mood and affect.  Imaging Studies: MM 3D SCREENING MAMMOGRAM BILATERAL BREAST Result Date: 10/11/2023 CLINICAL DATA:  Screening. EXAM: DIGITAL SCREENING BILATERAL MAMMOGRAM WITH TOMOSYNTHESIS AND CAD TECHNIQUE: Bilateral screening digital craniocaudal and mediolateral oblique mammograms were obtained. Bilateral screening digital breast tomosynthesis was performed. The images were evaluated with computer-aided detection. COMPARISON:  Previous exam(s). ACR Breast Density Category b: There are scattered areas of fibroglandular density. FINDINGS: There are no findings suspicious for malignancy. IMPRESSION: No mammographic evidence of malignancy. A result letter of this screening mammogram will be  mailed directly to the patient. RECOMMENDATION: Screening mammogram in one year. (Code:SM-B-01Y) BI-RADS CATEGORY  1: Negative. Electronically Signed   By: Baird Lyons M.D.   On: 10/11/2023 12:30   US SOFT TISSUE NECK Result Date: 10/02/2023 CLINICAL DATA:  Palpable abnormality involving the right supraclavicular fossa. History of breast cancer. EXAM: ULTRASOUND OF HEAD/NECK SOFT TISSUES TECHNIQUE: Ultrasound examination of the head and neck soft tissues was performed in the area of clinical concern. COMPARISON:  None Available. FINDINGS: Sonographic evaluation of the patient's palpable area of concern involving the right clavicular fossa is without sonographic correlate. Specifically, no regional supraclavicular lymphadenopathy. No discrete solid or cystic lesions. IMPRESSION: No sonographic correlate for patient's palpable area of concern involving the right supraclavicular fossa. Electronically Signed   By: Simonne Come M.D.   On: 10/02/2023 11:13    Assessment and Plan:  Roanna Reaves [name] Revis is a 48 y.o. y/o female has been referred for:   1.  Chronic lower abdominal pain  CTs abdomen pelvis with contrast 07/2021 and 2019, to evaluate RLQ pain, were unrevealing.  Normal appendix and gallbladder.  Previous hysterectomy.    2.  Epigastric Pain  Scheduling EGD I discussed risks of EGD with patient to include risk of bleeding, perforation, and risk of sedation.  Patient expressed understanding and agrees to proceed with EGD.   3.  Dysphagia  Schedule EGD with possible Dilation  4.  Hepatic steatosis and Moderate Daily Alcohol Use.  Recent LFTs normal.  Recommend a low-fat diet, regular exercise, and weight loss. Patient education handout about fatty liver disease was given and discussed from up-to-date.    Recommend she stop all alcohol.  5.  Colon cancer screening  Scheduling Colonoscopy I discussed risks of colonoscopy with patient to include risk of bleeding, colon perforation, and  risk of sedation.  Patient expressed understanding and agrees to proceed with colonoscopy.   Follow up 1 month after EGD / Colonoscopy with MD.  Celso Amy, PA-C

## 2023-11-09 ENCOUNTER — Telehealth (HOSPITAL_COMMUNITY): Payer: Self-pay

## 2023-11-09 NOTE — Telephone Encounter (Signed)
 Therapist receives a referral on this pt.  Her address is in [place]. Therapist consults with supervisor, Everlene Balls and he says if [facility] is not full, then we can accepts pts from surrounding counties.  Therapist calls and leaves a HIPAA compliant message for [facility] requesting a return call.  Remigio Eisenmenger, MS, LMFT, LCAS  11-09-23

## 2023-11-09 NOTE — Telephone Encounter (Signed)
 Frances Adams returns this therapist call.  Therapist confirms she is the correct person therapist was trying to reach by obtaining two verifiers.  Therapist explains we received a referral for [facility] from her primary care provider.  Therapist provides an overview of the [facility] program.  Frances Adams says she is not sure that she wants to be in [facility].  She says she will think about it and if she decides she will call this therapist back.  Remigio Eisenmenger, MS, LMFT, LCAS 11-09-23

## 2023-11-16 ENCOUNTER — Encounter: Payer: Self-pay | Admitting: General Practice

## 2023-11-16 ENCOUNTER — Ambulatory Visit (INDEPENDENT_AMBULATORY_CARE_PROVIDER_SITE_OTHER): Payer: Medicaid Other | Admitting: General Practice

## 2023-11-16 ENCOUNTER — Encounter: Payer: Self-pay | Admitting: Gastroenterology

## 2023-11-16 VITALS — BP 108/64 | HR 77 | Temp 98.1°F | Ht 62.5 in | Wt 135.0 lb

## 2023-11-16 DIAGNOSIS — F322 Major depressive disorder, single episode, severe without psychotic features: Secondary | ICD-10-CM | POA: Diagnosis not present

## 2023-11-16 DIAGNOSIS — E039 Hypothyroidism, unspecified: Secondary | ICD-10-CM | POA: Diagnosis not present

## 2023-11-16 DIAGNOSIS — F419 Anxiety disorder, unspecified: Secondary | ICD-10-CM

## 2023-11-16 NOTE — Assessment & Plan Note (Addendum)
 Uncontrolled.  She has been taking it correctly but has missed a dose last couple of days.   Will provide refill once we get the results.   Repeat TSH level pending.

## 2023-11-16 NOTE — Assessment & Plan Note (Signed)
 Uncontrolled.   Declines therapy referral or medication.   Encouraged patient regarding Wellbutrin and at this time, she declines again.   She will update if she changes her mind. Denies SI/HI.   F/u in 5 weeks.

## 2023-11-16 NOTE — Assessment & Plan Note (Signed)
 Uncontrolled.  Declines therapy referral or medication.  Encouraged patient to start daily SSRI such as Wellbutrin as it will help with the smoking cessation. She declines.   She would like to reconsider the chemical dependency clinic.  Information printed for patient.   F/u in 6 months.

## 2023-11-16 NOTE — Patient Instructions (Addendum)
 Call and make the appointment for the chemical dependency clinic.   Please let me know if you would like to start any of the anxiety/depression medication.   Be sure to take your levothyroxine (thyroid medication) every morning on an empty stomach with water only.   No food or other medications for 30 minutes.   No heartburn medication, iron pills, calcium, vitamin D, or magnesium pills within four hours of taking levothyroxine.   It was a pleasure to see you today!  Good afternoon,    I have sent your  referral to [facility] Outpatient office for their office to review.   They should contact you to schedule. Please contact their office if you haven't heard anything from them within 1-2 weeks. You may also give their office a call in a few days to schedule, you do not have to wait for them to contact you first.        Information for Referral #: [id number]   Diagnoses:   F41.9 (ICD-10-CM) - Anxiety   F10.10 (ICD-10-CM) - Alcohol abuse   Procedures: REF15 (Custom) - AMB REFERRAL TO CHEMICAL DEPENDENCY Authorization #:     Referring Provider Information Modesto Charon [facility] [phone] Referring To Provider Information [facility] [address] [place] [address] [phone]   Referral Start Date: 10/12/2023 Referral End Date: 10/11/2024      If there are any issues with this please let us know.     Have a good day! Nadara Eaton Referral Coordinator/CMA

## 2023-11-16 NOTE — Progress Notes (Signed)
 Established Patient Office Visit  Subjective   Patient ID: Frances Adams, female    DOB: 1976-01-25  Age: 48 y.o. MRN: [id number]  Chief Complaint  Patient presents with   Depression    And anxiety follow up   Hypothyroidism    Patient here for follow up labs    Depression        Associated symptoms include no headaches and no suicidal ideas.   Frances Adams is a 48 year old female with past medical history of hypothyroidism, hx of breast cancer, alcohol abuse, anxiety and depression presents today for a follow up.   Anxiety and depression: she was evaluated on 10/12/23 and was started on Hydroxyzine as needed, as she has declined a daily SSRI and therapy. Today she reports, she has not taken the hydroxyzine. She continues to have mind racing thoughts. She does not want to start medication or try therapy. She reports that she has tried Zoloft for one day in the past and it did not make her feel good and she just does not like taking medication. She would like to reconsider the chemical dependency clinic. She denies SI/HI.  Hypothyroidism: she was evaluated on 09/23/23 and was found to have elevated TSH. She was started on levothyroxine but she did not start it right away. Today she reports that she has been taking it correctly. She has missed her dose the last few days.    Patient Active Problem List   Diagnosis Date Noted   Severe major depressive disorder (HCC) 10/12/2023   Alcohol abuse 10/12/2023   Malignant neoplasm of right female breast (HCC) 09/23/2023   Hypothyroidism 09/23/2023   Abdominal pain 09/23/2023   Lymphadenopathy, supraclavicular 09/23/2023   Anxiety 09/08/2023   Smoker 09/08/2023   History of breast cancer 08/09/2016   Past Medical History:  Diagnosis Date   Asthma    Breast cancer (HCC) 2004   right breast cancer - chemotherapy and radiation   Breast cancer (HCC) 2008   right breast cancer - mammo site radiation, triple negative   Cancer  (HCC)    RT BREAST   Depression    Personal history of chemotherapy 2004   Personal history of radiation therapy 2004   Personal history of radiation therapy 2008   mammosite   Screening examination for venereal disease 09/08/2023   Allergies  Allergen Reactions   Other     Pt states she has an allergy to IV pain medication - unsure the medicine         11/16/2023    2:52 PM 10/12/2023   12:28 PM 09/16/2023    3:22 PM  Depression screen PHQ 2/9  Decreased Interest 1 1 3   Down, Depressed, Hopeless 2 3 3   PHQ - 2 Score 3 4 6   Altered sleeping 2 3   Tired, decreased energy 2 3   Change in appetite 3 3   Feeling bad or failure about yourself  1 3   Trouble concentrating 2 3   Moving slowly or fidgety/restless 3 2   Suicidal thoughts 0 2   PHQ-9 Score 16 23   Difficult doing work/chores Somewhat difficult Somewhat difficult        11/16/2023    2:53 PM 10/12/2023   12:28 PM  GAD 7 : Generalized Anxiety Score  Nervous, Anxious, on Edge 3 3  Control/stop worrying 3 3  Worry too much - different things 3 3  Trouble relaxing 3 3  Restless 3  3  Easily annoyed or irritable 3 3  Afraid - awful might happen 3 3  Total GAD 7 Score 21 21  Anxiety Difficulty Very difficult Somewhat difficult      Review of Systems  Constitutional:  Negative for chills and fever.  Respiratory:  Negative for shortness of breath.   Cardiovascular:  Negative for chest pain.  Gastrointestinal:  Negative for abdominal pain, constipation, diarrhea, heartburn, nausea and vomiting.  Genitourinary:  Negative for dysuria, frequency and urgency.  Neurological:  Negative for dizziness and headaches.  Endo/Heme/Allergies:  Negative for polydipsia.  Psychiatric/Behavioral:  Positive for depression. Negative for suicidal ideas. The patient is not nervous/anxious.       Objective:     BP 108/64 (BP Location: Left Arm, Patient Position: Sitting, Cuff Size: Normal)   Pulse 77   Temp 98.1 F (36.7 C)  (Oral)   Ht 5' 2.5" (1.588 m)   Wt 135 lb (61.2 kg)   SpO2 99%   BMI 24.30 kg/m  BP Readings from Last 3 Encounters:  11/16/23 108/64  10/21/23 111/76  10/12/23 110/72   Wt Readings from Last 3 Encounters:  11/16/23 135 lb (61.2 kg)  10/21/23 134 lb (60.8 kg)  10/12/23 134 lb (60.8 kg)      Physical Exam Vitals and nursing note reviewed.  Constitutional:      Appearance: Normal appearance.  Cardiovascular:     Rate and Rhythm: Normal rate and regular rhythm.     Pulses: Normal pulses.     Heart sounds: Normal heart sounds.  Pulmonary:     Effort: Pulmonary effort is normal.     Breath sounds: Normal breath sounds.  Neurological:     Mental Status: She is alert and oriented to person, place, and time.  Psychiatric:        Mood and Affect: Mood normal.        Behavior: Behavior normal.        Thought Content: Thought content normal.        Judgment: Judgment normal.      No results found for any visits on 11/16/23.     The ASCVD Risk score (Arnett DK, et al., 2019) failed to calculate for the following reasons:   Cannot find a previous HDL lab   Cannot find a previous total cholesterol lab    Assessment & Plan:  Hypothyroidism, unspecified type Assessment & Plan: Uncontrolled.  She has been taking it correctly but has missed a dose last couple of days.   Will provide refill once we get the results.   Repeat TSH level pending.  Orders: -     TSH  Anxiety Assessment & Plan: Uncontrolled.  Declines therapy referral or medication.  Encouraged patient to start daily SSRI such as Wellbutrin as it will help with the smoking cessation. She declines.   She would like to reconsider the chemical dependency clinic.  Information printed for patient.   F/u in 6 months.   Severe major depressive disorder (HCC) Assessment & Plan: Uncontrolled.   Declines therapy referral or medication.   Encouraged patient regarding Wellbutrin and at this time, she  declines again.   She will update if she changes her mind. Denies SI/HI.   F/u in 5 weeks.     Return in about 6 months (around 05/18/2024) for physical with labs (cholesterol and TSH).    Modesto Charon, NP

## 2023-11-17 ENCOUNTER — Ambulatory Visit
Admission: RE | Admit: 2023-11-17 | Discharge: 2023-11-17 | Disposition: A | Discharge status: Home / Self Care | Payer: Medicaid Other | Attending: Gastroenterology | Admitting: Gastroenterology

## 2023-11-17 ENCOUNTER — Ambulatory Visit: Admitting: Anesthesiology

## 2023-11-17 ENCOUNTER — Encounter: Payer: Self-pay | Admitting: General Practice

## 2023-11-17 ENCOUNTER — Encounter
Admission: RE | Disposition: A | Discharge status: Home / Self Care | Payer: Self-pay | Source: Home / Self Care | Attending: Gastroenterology

## 2023-11-17 ENCOUNTER — Encounter: Payer: Self-pay | Admitting: Gastroenterology

## 2023-11-17 DIAGNOSIS — Z5941 Food insecurity: Secondary | ICD-10-CM | POA: Diagnosis not present

## 2023-11-17 DIAGNOSIS — K635 Polyp of colon: Secondary | ICD-10-CM

## 2023-11-17 DIAGNOSIS — K222 Esophageal obstruction: Secondary | ICD-10-CM | POA: Diagnosis not present

## 2023-11-17 DIAGNOSIS — Z1211 Encounter for screening for malignant neoplasm of colon: Secondary | ICD-10-CM | POA: Diagnosis present

## 2023-11-17 DIAGNOSIS — D126 Benign neoplasm of colon, unspecified: Secondary | ICD-10-CM

## 2023-11-17 DIAGNOSIS — F17210 Nicotine dependence, cigarettes, uncomplicated: Secondary | ICD-10-CM | POA: Diagnosis not present

## 2023-11-17 DIAGNOSIS — D122 Benign neoplasm of ascending colon: Secondary | ICD-10-CM | POA: Insufficient documentation

## 2023-11-17 DIAGNOSIS — D120 Benign neoplasm of cecum: Secondary | ICD-10-CM | POA: Insufficient documentation

## 2023-11-17 DIAGNOSIS — R1319 Other dysphagia: Secondary | ICD-10-CM

## 2023-11-17 DIAGNOSIS — R1310 Dysphagia, unspecified: Secondary | ICD-10-CM | POA: Insufficient documentation

## 2023-11-17 DIAGNOSIS — R1013 Epigastric pain: Secondary | ICD-10-CM

## 2023-11-17 HISTORY — PX: ESOPHAGOGASTRODUODENOSCOPY (EGD) WITH PROPOFOL: SHX5813

## 2023-11-17 HISTORY — PX: POLYPECTOMY: SHX5525

## 2023-11-17 HISTORY — PX: ESOPHAGEAL DILATION: SHX303

## 2023-11-17 HISTORY — PX: COLONOSCOPY WITH PROPOFOL: SHX5780

## 2023-11-17 LAB — TSH: TSH: 2.7 u[IU]/mL (ref 0.35–5.50)

## 2023-11-17 SURGERY — ESOPHAGOGASTRODUODENOSCOPY (EGD) WITH PROPOFOL
Anesthesia: General

## 2023-11-17 MED ORDER — DEXMEDETOMIDINE HCL IN NACL 80 MCG/20ML IV SOLN
INTRAVENOUS | Status: DC | PRN
  Administered 2023-11-17: 12 ug via INTRAVENOUS

## 2023-11-17 MED ORDER — SODIUM CHLORIDE 0.9 % IV SOLN
INTRAVENOUS | Status: DC
Start: 2023-11-17 — End: 2023-11-17

## 2023-11-17 MED ORDER — OMEPRAZOLE MAGNESIUM 20 MG PO TBEC: 40.0000 mg | DELAYED_RELEASE_TABLET | Freq: Every evening | ORAL | 3 refills | Status: DC

## 2023-11-17 MED ORDER — PROPOFOL 10 MG/ML IV BOLUS
INTRAVENOUS | Status: DC | PRN
  Administered 2023-11-17: 30 mg via INTRAVENOUS
  Administered 2023-11-17: 125 ug/kg/min via INTRAVENOUS
  Administered 2023-11-17: 40 mg via INTRAVENOUS
  Administered 2023-11-17: 30 mg via INTRAVENOUS
  Administered 2023-11-17: 100 mg via INTRAVENOUS

## 2023-11-17 MED ORDER — LIDOCAINE HCL (CARDIAC) PF 100 MG/5ML IV SOSY
PREFILLED_SYRINGE | INTRAVENOUS | Status: DC | PRN
  Administered 2023-11-17: 50 mg via INTRAVENOUS

## 2023-11-17 NOTE — Anesthesia Procedure Notes (Signed)
 Date/Time: 11/17/2023 9:15 AM  Performed by: Ginger Carne, CRNAPre-anesthesia Checklist: Patient identified, Emergency Drugs available, Suction available, Patient being monitored and Timeout performed Patient Re-evaluated:Patient Re-evaluated prior to induction Oxygen Delivery Method: Nasal cannula Preoxygenation: Pre-oxygenation with 100% oxygen Induction Type: IV induction

## 2023-11-17 NOTE — Transfer of Care (Signed)
 Immediate Anesthesia Transfer of Care Note  Patient: Frances Adams  Procedure(s) Performed: ESOPHAGOGASTRODUODENOSCOPY (EGD) WITH PROPOFOL COLONOSCOPY WITH PROPOFOL DILATION, ESOPHAGUS POLYPECTOMY  Patient Location: Endoscopy Unit  Anesthesia Type:General  Level of Consciousness: awake and drowsy  Airway & Oxygen Therapy: Patient Spontanous Breathing  Post-op Assessment: Report given to RN and Post -op Vital signs reviewed and stable  Post vital signs: Reviewed and stable  Last Vitals:  Vitals Value Taken Time  BP 107/55 11/17/23 1013  Temp    Pulse 69 11/17/23 1013  Resp 22 11/17/23 1013  SpO2 97 % 11/17/23 1013    Last Pain:  Vitals:   11/17/23 0919  TempSrc: Temporal  PainSc: 1          Complications: No notable events documented.

## 2023-11-17 NOTE — Op Note (Signed)
 [facility] Patient Name: Frances Adams Revis Procedure Date: 11/17/2023 9:39 AM MRN: [id number] Account #: [id number] Date of Birth: 04/28/1976 Admit Type: Outpatient Age: 48 Room: [facility] ENDO ROOM 3 Gender: Female Note Status: Finalized Instrument Name: Nelda Marseille [id number] Procedure:             Colonoscopy Indications:           Screening for colorectal malignant neoplasm Providers:             Wyline Mood MD, Modesto Charon NP MD Medicines:             Monitored Anesthesia Care Complications:         No immediate complications. Procedure:             Pre-Anesthesia Assessment:                        - Prior to the procedure, a History and Physical was                         performed, and patient medications, allergies and                         sensitivities were reviewed. The patient's tolerance                         of previous anesthesia was reviewed.                        - The risks and benefits of the procedure and the                         sedation options and risks were discussed with the                         patient. All questions were answered and informed                         consent was obtained.                        - ASA Grade Assessment: II - A patient with mild                         systemic disease.                        After obtaining informed consent, the colonoscope was                         passed under direct vision. Throughout the procedure,                         the patient's blood pressure, pulse, and oxygen                         saturations were monitored continuously. The                         Colonoscope was introduced through the anus and  advanced to the the cecum, identified by the                         appendiceal orifice. The colonoscopy was performed                         with ease. The patient tolerated the procedure well.                         The quality of  the bowel preparation was excellent.                         The ileocecal valve, appendiceal orifice, and rectum                         were photographed. Findings:      The perianal and digital rectal examinations were normal.      Two sessile polyps were found in the ascending colon and cecum. The       polyps were 3 to 4 mm in size. These polyps were removed with a cold       snare. Resection and retrieval were complete.      The exam was otherwise without abnormality on direct and retroflexion       views. Impression:            - Two 3 to 4 mm polyps in the ascending colon and in                         the cecum, removed with a cold snare. Resected and                         retrieved.                        - The examination was otherwise normal on direct and                         retroflexion views. Recommendation:        - Discharge patient to home (with escort).                        - Resume previous diet.                        - Continue present medications.                        - Await pathology results.                        - Repeat colonoscopy for surveillance based on                         pathology results.                        - Return to GI office as previously scheduled. Procedure Code(s):     --- Professional ---  16109, Colonoscopy, flexible; with removal of                         tumor(s), polyp(s), or other lesion(s) by snare                         technique Diagnosis Code(s):     --- Professional ---                        Z12.11, Encounter for screening for malignant neoplasm                         of colon                        D12.0, Benign neoplasm of cecum                        D12.2, Benign neoplasm of ascending colon CPT copyright 2022 American Medical Association. All rights reserved. The codes documented in this report are preliminary and upon coder review may  be revised to meet current compliance  requirements. Wyline Mood, MD Wyline Mood MD, MD 11/17/2023 10:11:16 AM This report has been signed electronically. Number of Addenda: 0 Note Initiated On: 11/17/2023 9:39 AM Scope Withdrawal Time: 0 hours 7 minutes 20 seconds  Total Procedure Duration: 0 hours 12 minutes 19 seconds  Estimated Blood Loss:  Estimated blood loss: none.      [facility]

## 2023-11-17 NOTE — Anesthesia Postprocedure Evaluation (Signed)
 Anesthesia Post Note  Patient: Frances Adams  Procedure(s) Performed: ESOPHAGOGASTRODUODENOSCOPY (EGD) WITH PROPOFOL COLONOSCOPY WITH PROPOFOL DILATION, ESOPHAGUS POLYPECTOMY  Patient location during evaluation: Endoscopy Anesthesia Type: General Level of consciousness: awake and alert Pain management: pain level controlled Vital Signs Assessment: post-procedure vital signs reviewed and stable Respiratory status: spontaneous breathing, nonlabored ventilation, respiratory function stable and patient connected to nasal cannula oxygen Cardiovascular status: blood pressure returned to baseline and stable Postop Assessment: no apparent nausea or vomiting Anesthetic complications: no   No notable events documented.   Last Vitals:  Vitals:   11/17/23 1013 11/17/23 1020  BP: (!) 107/55 122/67  Pulse: 69 63  Resp: (!) 22 (!) 23  Temp: (!) 35.8 C   SpO2: 97% 98%    Last Pain:  Vitals:   11/17/23 1013  TempSrc: Temporal  PainSc:                  [name] Jatavion Peaster
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
## 2023-11-17 NOTE — Op Note (Signed)
 [facility] Patient Name: Frances Adams Procedure Date: 11/17/2023 9:40 AM MRN: [id number] Account #: [id number] Date of Birth: 03/22/1976 Admit Type: Outpatient Age: 48 Room: [facility] ENDO ROOM 3 Gender: Female Note Status: Finalized Instrument Name: Upper Endoscope [id number] Procedure:             Upper GI endoscopy Indications:           Dysphagia Providers:             Wyline Mood MD, Modesto Charon NP MD Medicines:             Monitored Anesthesia Care Complications:         No immediate complications. Procedure:             Pre-Anesthesia Assessment:                        - Prior to the procedure, a History and Physical was                         performed, and patient medications, allergies and                         sensitivities were reviewed. The patient's tolerance                         of previous anesthesia was reviewed.                        - The risks and benefits of the procedure and the                         sedation options and risks were discussed with the                         patient. All questions were answered and informed                         consent was obtained.                        - ASA Grade Assessment: II - A patient with mild                         systemic disease.                        After obtaining informed consent, the endoscope was                         passed under direct vision. Throughout the procedure,                         the patient's blood pressure, pulse, and oxygen                         saturations were monitored continuously. The Endoscope                         was introduced through the mouth, and advanced to the  third part of duodenum. The upper GI endoscopy was                         accomplished with ease. The patient tolerated the                         procedure well. Findings:      The examined duodenum was normal.      The stomach was  normal.      The cardia and gastric fundus were normal on retroflexion.      One benign-appearing, intrinsic moderate (circumferential scarring or       stenosis; an endoscope may pass) stenosis was found at the       gastroesophageal junction. This stenosis measured 1.4 cm (inner       diameter). The stenosis was traversed. A TTS dilator was passed through       the scope. Dilation with a 15-16.5-18 mm balloon dilator was performed       to 16.5 mm. The dilation site was examined and showed moderate mucosal       disruption.      The exam was otherwise without abnormality. Impression:            - Normal examined duodenum.                        - Normal stomach.                        - Benign-appearing esophageal stenosis. Dilated.                        - The examination was otherwise normal.                        - No specimens collected. Recommendation:        - Use Prilosec (omeprazole) 40 mg PO daily for 3                         months.                        - Perform a colonoscopy today. Procedure Code(s):     --- Professional ---                        [id number], Esophagogastroduodenoscopy, flexible,                         transoral; with transendoscopic balloon dilation of                         esophagus (less than 30 mm diameter) Diagnosis Code(s):     --- Professional ---                        K22.2, Esophageal obstruction                        R13.10, Dysphagia, unspecified CPT copyright 2022 American Medical Association. All rights reserved. The codes documented in this report are preliminary and upon coder review may  be revised to meet current compliance requirements. Wyline Mood, MD Sharlet Salina  Tobi Bastos MD, MD 11/17/2023 9:54:37 AM This report has been signed electronically. Number of Addenda: 0 Note Initiated On: 11/17/2023 9:40 AM Estimated Blood Loss:  Estimated blood loss: none.      [facility]

## 2023-11-17 NOTE — Progress Notes (Unsigned)
 Celso Amy, PA-C [address]  [address]  [place], [place] [address]  Main: [phone]  Fax: [phone]   Primary Care Physician: Modesto Charon, NP  Primary Gastroenterologist:  Celso Amy, PA-C / Dr. Wyline Mood    CC: Follow-up abdominal pain and dysphagia  HPI: Frances Adams is a 48 y.o. female returns for 1 month follow-up of lower abdominal pain ongoing for over a year.  She went to [facility] ED for evaluation 09/13/2023.  Normal CBC, CMP, lipase labs.  Thought to have IBS. Was given Rx Dicyclomine 10mg  TID, however she never tried it.  She still has chronic, intermittent lower abdominal cramping and episodes of diarrhea alternating with constipation.  She denies chest pain, heartburn, or dysphagia.  CTs abdomen pelvis with contrast 07/2021 and 2019, to evaluate RLQ pain, showed no acute abnormality.  Normal appendix and gallbladder.  Previous hysterectomy.  Incidental hepatic steatosis.   11/17/2023 EGD by Dr. Tobi Bastos: Moderate distal esophageal stricture dilated to 18 mm.  Normal stomach and duodenum.  No biopsies.  11/17/2023 colonoscopy: 2 small (3 mm, 4 mm) polyps removed.  Excellent prep.  Otherwise normal.  Pathology results pending.  Current Outpatient Medications  Medication Sig Dispense Refill   levothyroxine (SYNTHROID) 25 MCG tablet Take 1 tablet (25 mcg total) by mouth daily. 90 tablet 0   No current facility-administered medications for this visit.    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, triple negative   Cancer (HCC)    RT BREAST   Depression    Personal history of chemotherapy 2004   Personal history of radiation therapy 2004   Personal history of radiation therapy 2008    mammosite   Screening examination for venereal disease 09/08/2023    Past Surgical History:  Procedure Laterality Date   ABDOMINAL HYSTERECTOMY     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 ENDOSCOPY;  Service: Gastroenterology;;   ESOPHAGOGASTRODUODENOSCOPY (EGD) WITH PROPOFOL N/A 11/17/2023   Procedure: ESOPHAGOGASTRODUODENOSCOPY (EGD) WITH PROPOFOL;  Surgeon: Wyline Mood, MD;  Location[facility] ENDOSCOPY;  Service: Gastroenterology;  Laterality: N/A;   I & D EXTREMITY Right 05/13/2019   Procedure: IRRIGATION AND DEBRIDEMENT MIDDLE FINGER;  Surgeon: Knute Neu, MD;  Location: [facility] OR;  Service: Plastics;  Laterality: Right;   PERCUTANEOUS PINNING Right 05/13/2019   Procedure: PERCUTANEOUS PINNING;  Surgeon: Knute Neu, MD;  Location: [facility] OR;  Service: Plastics;  Laterality: Right;   PERINEAL LACERATION REPAIR N/A 07/25/2019   Procedure: REPAIR OF VAGINAL LACERATION;  Surgeon: Conard Novak, MD;  Location: [facility] ORS;  Service: Gynecology;  Laterality: N/A;   POLYPECTOMY  11/17/2023   Procedure: POLYPECTOMY;  Surgeon: Wyline Mood, MD;  Location: [facility] ENDOSCOPY;  Service: Gastroenterology;;    Review of  Systems:    All systems reviewed and negative except where noted in HPI.   Physical Examination:   BP 122/72   Pulse 69   Temp 97.7 F (36.5 C)   Ht 5' 2.5" (1.588 m)   Wt 134 lb 6.4 oz (61 kg)   BMI 24.19 kg/m   General: Well-nourished, well-developed in no acute distress.  Lungs: Clear to auscultation bilaterally. Non-labored. Heart: Regular rate and rhythm, no  murmurs rubs or gallops.  Abdomen: Bowel sounds are normal; Abdomen is Soft; No hepatosplenomegaly, masses or hernias;  No Abdominal Tenderness; No guarding or rebound tenderness. Neuro: Alert and oriented x 3.  Grossly intact.  Psych: Alert and cooperative, depressed mood and affect.   Imaging Studies: No results found.  Assessment and Plan:   Frances Adams is a 48 y.o. y/o female returns for followup of:  1.  Distal esophageal stricture s/p esophageal dilation 11/17/2023.  Currently Asymtomatic.  Return if any recurrent dysphagia.  2.  Low Abdominal pain, irritable bowel syndrome, with Constipation and Diarrhea  Rx Dicyclomine 10mg  1 tablet 3 times daily before meals.  Start OTC Benefiber Powder 1-2 TBSP in a drink daily.  Try OTC Align Probiotic 1 capsule once daily.  If you have constipation, take OTC Miralax powder 17g once daily.  3.  History of colon polyps  2 small polyps removed, pathology pending  Likely needs repeat colonoscopy in 5 to 7 years pending pathology.   Celso Amy, PA-C  Follow up as needed if GI symptoms worsen or persist.

## 2023-11-17 NOTE — Anesthesia Preprocedure Evaluation (Signed)
 Anesthesia Evaluation  Patient identified by MRN, date of birth, ID band Patient awake    Reviewed: Allergy & Precautions, NPO status , Patient's Chart, lab work & pertinent test results  History of Anesthesia Complications Negative for: history of anesthetic complications  Airway Mallampati: III  TM Distance: <3 FB Neck ROM: full    Dental  (+) Chipped, Poor Dentition, Missing   Pulmonary neg shortness of breath, asthma , Current Smoker and Patient abstained from smoking.   Pulmonary exam normal        Cardiovascular (-) angina negative cardio ROS Normal cardiovascular exam     Neuro/Psych negative neurological ROS  negative psych ROS   GI/Hepatic negative GI ROS, Neg liver ROS,neg GERD  ,,  Endo/Other  Hypothyroidism    Renal/GU negative Renal ROS  negative genitourinary   Musculoskeletal   Abdominal   Peds  Hematology negative hematology ROS (+)   Anesthesia Other Findings Past Medical History: No date: Asthma 2004: Breast cancer (HCC)     Comment:  right breast cancer - chemotherapy and radiation 2008: Breast cancer (HCC)     Comment:  right breast cancer - mammo site radiation, triple               negative No date: Cancer (HCC)     Comment:  RT BREAST No date: Depression 2004: Personal history of chemotherapy 2004: Personal history of radiation therapy 2008: Personal history of radiation therapy     Comment:  mammosite 09/08/2023: Screening examination for venereal disease  Past Surgical History: No date: ABDOMINAL HYSTERECTOMY 2008: BREAST EXCISIONAL BIOPSY; Right     Comment:  DCIS 2007: BREAST EXCISIONAL BIOPSY; Right     Comment:  calcs, benign 2004: BREAST LUMPECTOMY; Right     Comment:  breast ca No date: BREAST SURGERY 05/13/2019: I & D EXTREMITY; Right     Comment:  Procedure: IRRIGATION AND DEBRIDEMENT MIDDLE FINGER;                Surgeon: Knute Neu, MD;  Location: [facility] OR;   Service:               Plastics;  Laterality: Right; 05/13/2019: PERCUTANEOUS PINNING; Right     Comment:  Procedure: PERCUTANEOUS PINNING;  Surgeon: Knute Neu, MD;  Location: [facility] OR;  Service: Plastics;                Laterality: Right; 07/25/2019: PERINEAL LACERATION REPAIR; N/A     Comment:  Procedure: REPAIR OF VAGINAL LACERATION;  Surgeon:               Conard Novak, MD;  Location: [facility] ORS;  Service:               Gynecology;  Laterality: N/A;  BMI    Body Mass Index: 24.12 kg/m      Reproductive/Obstetrics negative OB ROS                             Anesthesia Physical Anesthesia Plan  ASA: 3  Anesthesia Plan: General   Post-op Pain Management:    Induction: Intravenous  PONV Risk Score and Plan: Propofol infusion and TIVA  Airway Management Planned: Natural Airway and Nasal Cannula  Additional Equipment:   Intra-op Plan:   Post-operative Plan:   Informed Consent: I have reviewed the patients History and Physical, chart, labs  and discussed the procedure including the risks, benefits and alternatives for the proposed anesthesia with the patient or authorized representative who has indicated his/her understanding and acceptance.     Dental Advisory Given  Plan Discussed with: Anesthesiologist, CRNA and Surgeon  Anesthesia Plan Comments: (Patient consented for risks of anesthesia including but not limited to:  - adverse reactions to medications - risk of airway placement if required - damage to eyes, teeth, lips or other oral mucosa - nerve damage due to positioning  - sore throat or hoarseness - Damage to heart, brain, nerves, lungs, other parts of body or loss of life  Patient voiced understanding and assent.)       Anesthesia Quick Evaluation

## 2023-11-18 ENCOUNTER — Ambulatory Visit: Payer: Medicaid Other | Admitting: Physician Assistant

## 2023-11-18 ENCOUNTER — Encounter: Payer: Self-pay | Admitting: Physician Assistant

## 2023-11-18 VITALS — BP 122/72 | HR 69 | Temp 97.7°F | Ht 62.5 in | Wt 134.4 lb

## 2023-11-18 DIAGNOSIS — K222 Esophageal obstruction: Secondary | ICD-10-CM | POA: Diagnosis not present

## 2023-11-18 DIAGNOSIS — Z860100 Personal history of colon polyps, unspecified: Secondary | ICD-10-CM | POA: Diagnosis not present

## 2023-11-18 DIAGNOSIS — K582 Mixed irritable bowel syndrome: Secondary | ICD-10-CM | POA: Diagnosis not present

## 2023-11-18 LAB — SURGICAL PATHOLOGY

## 2023-11-18 MED ORDER — DICYCLOMINE HCL 10 MG PO CAPS: 10.0000 mg | ORAL_CAPSULE | Freq: Three times a day (TID) | ORAL | 11 refills | Status: AC

## 2023-11-18 NOTE — Patient Instructions (Signed)
   Start OTC Benefiber Powder. Mix 1 - 2 Tablespoons in 6 - 8 ounces of a Drink Once Daily. Drink 64 ounces of water / fluids Daily.     Take 1 Capsule Once Daily for 30 days. If GI symptoms improve, then OK to continue Align. If GI symptoms do not improve after 30 days, then discontinue.      For constipation: Start OTC Miralax Powder Mix 1 capful in 6 to 8 ounces of a drink once daily  Recommend high-fiber diet, 30 g of fiber daily Eat fruits, vegetables, and whole grains Drink 64 ounces of water / fluids daily.

## 2023-11-22 ENCOUNTER — Encounter: Payer: Self-pay | Admitting: Gastroenterology

## 2024-04-27 ENCOUNTER — Emergency Department

## 2024-04-27 ENCOUNTER — Emergency Department
Admission: EM | Admit: 2024-04-27 | Discharge: 2024-04-27 | Disposition: A | Discharge status: Home / Self Care | Attending: Emergency Medicine | Admitting: Emergency Medicine

## 2024-04-27 ENCOUNTER — Encounter: Payer: Self-pay | Admitting: Emergency Medicine

## 2024-04-27 ENCOUNTER — Other Ambulatory Visit: Payer: Self-pay

## 2024-04-27 DIAGNOSIS — S20311A Abrasion of right front wall of thorax, initial encounter: Secondary | ICD-10-CM | POA: Diagnosis not present

## 2024-04-27 DIAGNOSIS — Z853 Personal history of malignant neoplasm of breast: Secondary | ICD-10-CM | POA: Insufficient documentation

## 2024-04-27 DIAGNOSIS — W19XXXA Unspecified fall, initial encounter: Secondary | ICD-10-CM

## 2024-04-27 DIAGNOSIS — S92001A Unspecified fracture of right calcaneus, initial encounter for closed fracture: Secondary | ICD-10-CM

## 2024-04-27 DIAGNOSIS — W11XXXA Fall on and from ladder, initial encounter: Secondary | ICD-10-CM | POA: Diagnosis not present

## 2024-04-27 DIAGNOSIS — J45909 Unspecified asthma, uncomplicated: Secondary | ICD-10-CM | POA: Insufficient documentation

## 2024-04-27 DIAGNOSIS — S92014A Nondisplaced fracture of body of right calcaneus, initial encounter for closed fracture: Secondary | ICD-10-CM | POA: Insufficient documentation

## 2024-04-27 DIAGNOSIS — S99911A Unspecified injury of right ankle, initial encounter: Secondary | ICD-10-CM | POA: Diagnosis present

## 2024-04-27 MED ORDER — OXYCODONE HCL 5 MG PO TABS
5.0000 mg | ORAL_TABLET | Freq: Once | ORAL | Status: AC
  Administered 2024-04-27: 5 mg via ORAL
  Filled 2024-04-27: qty 1

## 2024-04-27 MED ORDER — OXYCODONE HCL 5 MG PO TABS
5.0000 mg | ORAL_TABLET | Freq: Three times a day (TID) | ORAL | 0 refills | Status: DC | PRN
Start: 2024-04-27 — End: 2024-05-09

## 2024-04-27 NOTE — Discharge Instructions (Addendum)
 Your evaluated in the ED for a fall.  Your x-ray of the ankle reveals a nondisplaced calcaneus (heel bone) fracture.  Your chest x-ray is normal and reveals no injury to your heart or lungs.  Get plenty of rest and apply ice over the affected area to help swelling.  You will need to follow-up with podiatry in 1 week for further management of broken foot bone.  In the interim, we recommend nonweightbearing status with the assistance of crutches until you can follow-up with podiatry.  Pain control:  Ibuprofen  (motrin /aleve /advil ) - You can take 3 tablets (600 mg) every 6 hours as needed for pain/fever.  Acetaminophen  (tylenol ) - You can take 2 extra strength tablets (1000 mg) every 6 hours as needed for pain/fever.  You can alternate these medications or take them together.  Make sure you eat food/drink water when taking these medications.

## 2024-04-27 NOTE — ED Provider Notes (Signed)
 [facility] Emergency Department Provider Note     Event Date/Time   First MD Initiated Contact with Patient 04/27/24 1725     (approximate)   History   Fall   HPI  Frances Adams is a 48 y.o. female with a history of asthma and breast cancer presents to the ED following a fall.  Patient reports she was on approximately 6 foot step ladder cutting trees with her husband when she lost her balance and fell onto her right foot.  She also complains of right rib pain as a branch hit her on the way down.  She denies head injury or LOC.  She is not on anticoagulations.  Denies chest pain.  Endorses some shortness of breath.  No other complaint.    Physical Exam   Triage Vital Signs: ED Triage Vitals  Encounter Vitals Group     BP 04/27/24 1702 102/65     Girls Systolic BP Percentile --      Girls Diastolic BP Percentile --      Boys Systolic BP Percentile --      Boys Diastolic BP Percentile --      Pulse Rate 04/27/24 1702 (!) 55     Resp 04/27/24 1702 17     Temp 04/27/24 1702 98 F (36.7 C)     Temp Source 04/27/24 1702 Oral     SpO2 04/27/24 1702 97 %     Weight 04/27/24 1700 135 lb (61.2 kg)     Height 04/27/24 1700 5' 2 (1.575 m)     Head Circumference --      Peak Flow --      Pain Score 04/27/24 1700 10     Pain Loc --      Pain Education --      Exclude from Growth Chart --     Most recent vital signs: Vitals:   04/27/24 1702  BP: 102/65  Pulse: (!) 55  Resp: 17  Temp: 98 F (36.7 C)  SpO2: 97%    General Obvious discomfort. Awake, no distress.  HEENT NCAT.  CV:  Good peripheral perfusion.  RRR RESP:  Normal effort.  LCTAB, no retraction ABD:  No distention.  Other:  Right ankle reveals circumferential edema more prominent to medial malleolus. Tenderness to medial aspect over medial malleolus.  Ecchymosis noted.  Pedal pulse palpated.  Full range of motion to digits.  Limited range of motion to ankle secondary to pain  and swelling.  Neurovascular status intact throughout.  Right rib cage reveals small abrasion. Tenderness to palpation. No ecchymosis or edema.   ED Results / Procedures / Treatments   Labs (all labs ordered are listed, but only abnormal results are displayed) Labs Reviewed - No data to display  RADIOLOGY  I personally viewed and evaluated these images as part of my medical decision making, as well as reviewing the written report by the radiologist.  ED Provider Interpretation: normal right rib xray. Normal chest. Acute calcaneus fracture.  DG Ribs Unilateral W/Chest Right Result Date: 04/27/2024 CLINICAL DATA:  Pain after a fall. EXAM: RIGHT RIBS AND CHEST - 3+ VIEW COMPARISON:  07/02/2021 FINDINGS: Shallow inspiration. Heart size and pulmonary vascularity are normal for technique. Lungs are clear. No pleural effusion or pneumothorax. Mediastinal contours appear intact. Right ribs appear intact. No acute displaced fractures are identified. Soft tissues are unremarkable. IMPRESSION: No evidence of active pulmonary disease.  Negative right ribs. Electronically Signed   By:  Elsie Gravely M.D.   On: 04/27/2024 17:33   DG Ankle Complete Right Result Date: 04/27/2024 CLINICAL DATA:  Pain after a fall. EXAM: RIGHT ANKLE - COMPLETE 3+ VIEW COMPARISON:  None Available. FINDINGS: Soft tissue swelling about the right ankle greatest medially. Vague linear lucencies throughout the body of the calcaneus with inferior cortical irregularity and step-off suggesting nondisplaced calcaneal fractures. Fractures likely extend to the posterior talocalcaneal joint. The ankle mortise appears intact. Degenerative changes in the intertarsal joints. IMPRESSION: Nondisplaced fractures of the body of the calcaneus. Diffuse soft tissue swelling to the right ankle. Electronically Signed   By: Elsie Gravely M.D.   On: 04/27/2024 17:31    PROCEDURES:  Critical Care performed: No  Procedures   MEDICATIONS  ORDERED IN ED: Medications  oxyCODONE  (Oxy IR/ROXICODONE ) immediate release tablet 5 mg (5 mg Oral Given 04/27/24 1800)     IMPRESSION / MDM / ASSESSMENT AND PLAN / ED COURSE  I reviewed the triage vital signs and the nursing notes.                              Clinical Course as of 04/27/24 1928  Thu Apr 27, 2024  1805 DG Ribs Unilateral W/Chest Right IMPRESSION: No evidence of active pulmonary disease.  Negative right ribs.   [MH]  1806 DG Ankle Complete Right IMPRESSION: Nondisplaced fractures of the body of the calcaneus. Diffuse soft tissue swelling to the right ankle.   [MH]    Clinical Course User Index [MH] Margrette Monte A, PA-C    48 y.o. female presents to the emergency department for evaluation and treatment of fall. See HPI for further details.   Differential diagnosis includes, but is not limited to fracture, dislocation, ligamentous injury, pneumothorax, contusion  Patient's presentation is most consistent with acute complicated illness / injury requiring diagnostic workup.   Patient is alert and oriented.  She is hemodynamically stable on initial assessment.  Physical exam findings are as stated above.  X-ray of chest and right rib is reassuring.  X-ray of ankle reveals nondisplaced fracture of the body of the calcaneus and diffuse soft tissue swelling to the right ankle but ankle mortise remains intact.  Patient placed in posterior short leg splint and provided with crutches.  She is advised to follow-up with podiatry for further evaluation.  Advised nonweightbearing status until podiatry follow-up.  Patient verbalized understanding.  Pain control with oxycodone .  Will send short course to pharmacy.  Patient stable condition for discharge home.  ED return precaution discussed.   FINAL CLINICAL IMPRESSION(S) / ED DIAGNOSES   Final diagnoses:  Fall, initial encounter  Closed nondisplaced fracture of right calcaneus, unspecified portion of calcaneus, initial  encounter   Rx / DC Orders   ED Discharge Orders          Ordered    oxyCODONE  (ROXICODONE ) 5 MG immediate release tablet  Every 8 hours PRN        04/27/24 1817             Note:  This document was prepared using Dragon voice recognition software and may include unintentional dictation errors.    Margrette, Mera Gunkel A, PA-C 04/27/24 GENNIE Arlander [place], MD 04/27/24 [phone]

## 2024-04-27 NOTE — ED Triage Notes (Signed)
 Patient to ED via POV after a fall. PT reports falling off a 6 ft step ladder. Denies hitting head, LOC or blood thinners. C/o right ankle and right rib pain.

## 2024-05-02 ENCOUNTER — Ambulatory Visit (INDEPENDENT_AMBULATORY_CARE_PROVIDER_SITE_OTHER): Admitting: Podiatry

## 2024-05-02 ENCOUNTER — Encounter: Payer: Self-pay | Admitting: Podiatry

## 2024-05-02 VITALS — Ht 62.0 in | Wt 135.0 lb

## 2024-05-02 DIAGNOSIS — S92001A Unspecified fracture of right calcaneus, initial encounter for closed fracture: Secondary | ICD-10-CM

## 2024-05-02 MED ORDER — OXYCODONE-ACETAMINOPHEN 5-325 MG PO TABS: 1.0000 | ORAL_TABLET | ORAL | 0 refills | Status: DC | PRN

## 2024-05-02 NOTE — Progress Notes (Signed)
 Chief Complaint  Patient presents with   Ankle Injury    Pt is here due to right ankle fracture on 8/28 states she fell off a ladder, was seen at ER was told that there a was a fracture to the ankle, pain level is 7/10, has blisters to the left side of ankle.    HPI: 48 y.o. female presenting today as a referral from the [facility] ED for calcaneal fracture right lower extremity.  Patient states that she fell off a ladder at home.  DOI: 04/27/2024.  She went to the emergency department and diagnosed with fracture and posterior splint was applied.  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, triple negative   Cancer (HCC)    RT BREAST   Depression    Personal history of chemotherapy 2004   Personal history of radiation therapy 2004   Personal history of radiation therapy 2008   mammosite   Screening examination for venereal disease 09/08/2023    Past Surgical History:  Procedure Laterality Date   ABDOMINAL HYSTERECTOMY     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   BREAST EXCISIONAL BIOPSY Right 2007   calcs, benign   BREAST LUMPECTOMY Right 2004   breast ca   BREAST SURGERY     COLONOSCOPY WITH PROPOFOL  N/A 11/17/2023   Procedure: COLONOSCOPY WITH PROPOFOL ;  Surgeon: Therisa Bi, MD;  Location: [facility] ENDOSCOPY;  Service: Gastroenterology;  Laterality: N/A;   ESOPHAGEAL DILATION  11/17/2023   Procedure: DILATION, ESOPHAGUS;  Surgeon: Therisa Bi, MD;  Location: [facility] ENDOSCOPY;  Service: Gastroenterology;;   ESOPHAGOGASTRODUODENOSCOPY (EGD) WITH PROPOFOL  N/A 11/17/2023   Procedure: ESOPHAGOGASTRODUODENOSCOPY (EGD) WITH PROPOFOL ;  Surgeon: Therisa Bi, MD;  Location: [facility] ENDOSCOPY;  Service: Gastroenterology;  Laterality: N/A;   I & D EXTREMITY Right 05/13/2019   Procedure: IRRIGATION AND  DEBRIDEMENT MIDDLE FINGER;  Surgeon: Lorretta Dess, MD;  Location: [facility] OR;  Service: Plastics;  Laterality: Right;   PERCUTANEOUS PINNING Right 05/13/2019   Procedure: PERCUTANEOUS PINNING;  Surgeon: Lorretta Dess, MD;  Location: [facility] OR;  Service: Plastics;  Laterality: Right;   PERINEAL LACERATION REPAIR N/A 07/25/2019   Procedure: REPAIR OF VAGINAL LACERATION;  Surgeon: Leonce Garnette BIRCH, MD;  Location: [facility] ORS;  Service: Gynecology;  Laterality: N/A;   POLYPECTOMY  11/17/2023   Procedure: POLYPECTOMY;  Surgeon: Therisa Bi, MD;  Location: [facility] ENDOSCOPY;  Service: Gastroenterology;;    Allergies  Allergen Reactions   Other     Pt states she has an allergy to IV pain medication - unsure the medicine     Physical Exam: General: The patient is alert and oriented x3 in no acute distress.  Dermatology: Skin is warm, dry and supple bilateral lower extremities.  Serosanguineous fracture blisters noted to the medial aspect of the right ankle.  No indication of infection  Vascular: Palpable pedal pulses bilaterally. Capillary refill within normal limits.  Heavy ecchymosis with edema noted right foot and ankle  Neurological: Grossly intact via light touch  Musculoskeletal Exam: Overall the foot is in rectus alignment with no gross deformity  DG Ankle Complete Right 04/27/2024 IMPRESSION: Nondisplaced fractures of the body of the calcaneus. Diffuse soft tissue swelling to the right ankle.  Assessment/Plan of Care: 1.  Closed comminuted nondisplaced calcaneal  fracture right 2.  Superficial fracture blisters  -Patient evaluated.  X-rays reviewed -Given the comminution noted within the body of the calcaneus recommend CT scan right foot to visualize intra-articular fractures -The fracture blisters to the medial aspect of the ankle were perforated and drained today.  Dressings applied. -Compressive wraps applied. -Stressed the importance of nonweightbearing to the extremity in combination  with elevation and ice. -Will plan to contact the patient after CT scan results are available to discuss.  At the moment I do not anticipate surgery. -If CT scan results demonstrate any need for surgical intervention we will have her come back into the office for surgical consult and to discuss.  If we continue to pursue conservative care return to clinic 6 weeks follow-up x-ray RT foot       Thresa EMERSON Sar, DPM [facility]  Dr. Thresa EMERSON Sar, DPM    [address] [place], [place] [address]                Office [phone]  Fax [phone]

## 2024-05-02 NOTE — Addendum Note (Signed)
 Addended by: JANIT THRESA HERO on: 05/02/2024 10:24 AM   Modules accepted: Orders

## 2024-05-05 ENCOUNTER — Ambulatory Visit
Admission: RE | Admit: 2024-05-05 | Discharge: 2024-05-05 | Disposition: A | Discharge status: Home / Self Care | Source: Ambulatory Visit | Attending: Podiatry | Admitting: Podiatry

## 2024-05-05 DIAGNOSIS — S92001A Unspecified fracture of right calcaneus, initial encounter for closed fracture: Secondary | ICD-10-CM

## 2024-05-09 ENCOUNTER — Other Ambulatory Visit: Payer: Self-pay | Admitting: Podiatry

## 2024-05-09 ENCOUNTER — Telehealth: Payer: Self-pay | Admitting: Podiatry

## 2024-05-09 MED ORDER — OXYCODONE-ACETAMINOPHEN 5-325 MG PO TABS: 1.0000 | ORAL_TABLET | ORAL | 0 refills | Status: DC | PRN

## 2024-05-09 NOTE — Progress Notes (Signed)
PRN pain 

## 2024-05-09 NOTE — Telephone Encounter (Signed)
 Patient called requesting a refill of pain medicine. Her preferred pharmacy is the [facility] in [place] on [address].

## 2024-05-17 ENCOUNTER — Telehealth: Payer: Self-pay | Admitting: Podiatry

## 2024-05-17 NOTE — Telephone Encounter (Signed)
 Patient came in to pick up handicap placard form for 3 months.

## 2024-05-19 ENCOUNTER — Encounter: Payer: Self-pay | Admitting: General Practice

## 2024-05-19 ENCOUNTER — Ambulatory Visit (INDEPENDENT_AMBULATORY_CARE_PROVIDER_SITE_OTHER): Admitting: General Practice

## 2024-05-19 VITALS — BP 116/60 | HR 73 | Temp 98.6°F | Ht 62.0 in | Wt 126.0 lb

## 2024-05-19 DIAGNOSIS — Z0000 Encounter for general adult medical examination without abnormal findings: Secondary | ICD-10-CM | POA: Insufficient documentation

## 2024-05-19 NOTE — Progress Notes (Signed)
 Established Patient Office Visit  Subjective   Patient ID: Frances Adams, female    DOB: [birth year]  Age: 48 y.o. MRN: [id number]  Chief Complaint  Patient presents with   Annual Exam    HPI  Frances Adams is a 48 year old female with past medical history  for complete physical and follow up of chronic conditions.  Immunizations: -Tetanus: Completed in 2020 -Influenza: declines -Pneumonia: declines  Diet: Fair diet.  Exercise: No regular exercise. Physically active.   Eye exam: Completed several years ago.  Dental exam: Completed several years ago.   Mammogram: Completed in 2025  Colonoscopy: Completed in 2025   Patient Active Problem List   Diagnosis Date Noted   Encounter for screening and preventative care 05/19/2024   Adenomatous polyp of colon 11/17/2023   Other dysphagia 11/17/2023   Severe major depressive disorder (HCC) 10/12/2023   Alcohol abuse 10/12/2023   Malignant neoplasm of right female breast (HCC) 09/23/2023   Hypothyroidism 09/23/2023   Abdominal pain 09/23/2023   Lymphadenopathy, supraclavicular 09/23/2023   Anxiety 09/08/2023   Special screening for malignant neoplasms, colon 09/08/2023   Smoker 09/08/2023   History of breast cancer 08/09/2016   Past Medical History:  Diagnosis Date   Asthma    Breast cancer (HCC) 2004   right breast cancer - chemotherapy and radiation   Breast cancer (HCC) 2008   right breast cancer - mammo site radiation, triple negative   Cancer (HCC)    RT BREAST   Depression    Personal history of chemotherapy 2004   Personal history of radiation therapy 2004   Personal history of radiation therapy 2008   mammosite   Screening examination for venereal disease 09/08/2023   Past Surgical History:  Procedure Laterality Date   ABDOMINAL HYSTERECTOMY     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   BREAST EXCISIONAL BIOPSY Right 2007   calcs, benign   BREAST LUMPECTOMY Right 2004   breast ca    BREAST SURGERY     COLONOSCOPY WITH PROPOFOL  N/A 11/17/2023   Procedure: COLONOSCOPY WITH PROPOFOL ;  Surgeon: Therisa Bi, MD;  Location: [facility] ENDOSCOPY;  Service: Gastroenterology;  Laterality: N/A;   ESOPHAGEAL DILATION  11/17/2023   Procedure: DILATION, ESOPHAGUS;  Surgeon: Therisa Bi, MD;  Location: [facility] ENDOSCOPY;  Service: Gastroenterology;;   ESOPHAGOGASTRODUODENOSCOPY (EGD) WITH PROPOFOL  N/A 11/17/2023   Procedure: ESOPHAGOGASTRODUODENOSCOPY (EGD) WITH PROPOFOL ;  Surgeon: Therisa Bi, MD;  Location: [facility] ENDOSCOPY;  Service: Gastroenterology;  Laterality: N/A;   I & D EXTREMITY Right 05/13/2019   Procedure: IRRIGATION AND DEBRIDEMENT MIDDLE FINGER;  Surgeon: Lorretta Dess, MD;  Location: [facility] OR;  Service: Plastics;  Laterality: Right;   PERCUTANEOUS PINNING Right 05/13/2019   Procedure: PERCUTANEOUS PINNING;  Surgeon: Lorretta Dess, MD;  Location: [facility] OR;  Service: Plastics;  Laterality: Right;   PERINEAL LACERATION REPAIR N/A 07/25/2019   Procedure: REPAIR OF VAGINAL LACERATION;  Surgeon: Leonce Garnette BIRCH, MD;  Location: [facility] ORS;  Service: Gynecology;  Laterality: N/A;   POLYPECTOMY  11/17/2023   Procedure: POLYPECTOMY;  Surgeon: Therisa Bi, MD;  Location: [facility] ENDOSCOPY;  Service: Gastroenterology;;   Social History   Tobacco Use   Smoking status: Every Day    Current packs/day: 0.50    Average packs/day: 0.5 packs/day for 35.2 years (17.6 ttl pk-yrs)    Types: Cigarettes    Start date: 03/24/1989   Smokeless tobacco: Never  Vaping Use   Vaping status: Former  Substance Use Topics  Alcohol use: Yes    Comment: 2-3 daily (mikes hard lemonade)   Drug use: No   Allergies  Allergen Reactions   Other     Pt states she has an allergy to IV pain medication - unsure the medicine         05/19/2024    2:02 PM 11/16/2023    2:52 PM 10/12/2023   12:28 PM  Depression screen PHQ 2/9  Decreased Interest 1 1 1   Down, Depressed, Hopeless 2 2 3   PHQ - 2 Score 3 3 4   Altered  sleeping 1 2 3   Tired, decreased energy 3 2 3   Change in appetite 3 3 3   Feeling bad or failure about yourself  0 1 3  Trouble concentrating 3 2 3   Moving slowly or fidgety/restless 1 3 2   Suicidal thoughts 0 0 2  PHQ-9 Score 14 16 23   Difficult doing work/chores Not difficult at all Somewhat difficult Somewhat difficult       05/19/2024    2:03 PM 11/16/2023    2:53 PM 10/12/2023   12:28 PM  GAD 7 : Generalized Anxiety Score  Nervous, Anxious, on Edge 3 3 3   Control/stop worrying 3 3 3   Worry too much - different things 3 3 3   Trouble relaxing 3 3 3   Restless 3 3 3   Easily annoyed or irritable 3 3 3   Afraid - awful might happen 3 3 3   Total GAD 7 Score 21 21 21   Anxiety Difficulty Not difficult at all Very difficult Somewhat difficult      Review of Systems  Constitutional:  Negative for chills, fever, malaise/fatigue and weight loss.  HENT:  Negative for congestion, ear discharge, ear pain, hearing loss, nosebleeds, sinus pain, sore throat and tinnitus.   Eyes:  Negative for blurred vision, double vision, pain, discharge and redness.  Respiratory:  Negative for cough, shortness of breath, wheezing and stridor.   Cardiovascular:  Negative for chest pain, palpitations and leg swelling.  Gastrointestinal:  Negative for abdominal pain, constipation, diarrhea, heartburn, nausea and vomiting.  Genitourinary:  Negative for dysuria, frequency and urgency.  Musculoskeletal:  Negative for myalgias.  Skin:  Negative for rash.  Neurological:  Negative for dizziness, tingling, seizures, weakness and headaches.  Psychiatric/Behavioral:  Negative for depression, substance abuse and suicidal ideas. The patient is not nervous/anxious.       Objective:     BP 116/60   Pulse 73   Temp 98.6 F (37 C) (Oral)   Ht 5' 2 (1.575 m)   Wt 126 lb (57.2 kg)   SpO2 96%   BMI 23.05 kg/m  BP Readings from Last 3 Encounters:  05/19/24 116/60  04/27/24 102/65  11/18/23 122/72   Wt Readings  from Last 3 Encounters:  05/19/24 126 lb (57.2 kg)  05/02/24 135 lb (61.2 kg)  04/27/24 135 lb (61.2 kg)      Physical Exam Vitals and nursing note reviewed.  Constitutional:      Appearance: Normal appearance.  HENT:     Head: Normocephalic and atraumatic.     Right Ear: Tympanic membrane, ear canal and external ear normal.     Left Ear: Tympanic membrane, ear canal and external ear normal.     Nose: Nose normal.     Mouth/Throat:     Mouth: Mucous membranes are moist.     Pharynx: Oropharynx is clear.  Eyes:     Conjunctiva/sclera: Conjunctivae normal.     Pupils: Pupils are equal,  round, and reactive to light.  Cardiovascular:     Rate and Rhythm: Normal rate and regular rhythm.     Pulses: Normal pulses.     Heart sounds: Normal heart sounds.  Pulmonary:     Effort: Pulmonary effort is normal.     Breath sounds: Normal breath sounds.  Abdominal:     General: Abdomen is flat. Bowel sounds are normal.     Palpations: Abdomen is soft.  Musculoskeletal:        General: Normal range of motion.     Cervical back: Normal range of motion.     Comments: Has a boot on right leg- broken heel.  Skin:    General: Skin is warm and dry.     Capillary Refill: Capillary refill takes less than 2 seconds.  Neurological:     General: No focal deficit present.     Mental Status: She is alert and oriented to person, place, and time. Mental status is at baseline.  Psychiatric:        Mood and Affect: Mood normal.        Behavior: Behavior normal.        Thought Content: Thought content normal.        Judgment: Judgment normal.      No results found for any visits on 05/19/24.     The ASCVD Risk score (Arnett DK, et al., 2019) failed to calculate for the following reasons:   Cannot find a previous HDL lab   Cannot find a previous total cholesterol lab    Assessment & Plan:  Encounter for screening and preventative care Assessment & Plan: Immunizations Tdap- UTD; declines  influenza and prevnar.  Pap smear - hysterectomy.  Mammogram UTD.  Colonoscopy UTD  Discussed the importance of a healthy diet and regular exercise in order for weight loss, and to reduce the risk of further co-morbidity.  Exam stable. Labs pending.  Follow up in 1 year for repeat physical.   Orders: -     Lipid panel -     TSH     Return in about 6 months (around 11/16/2024) for chronic care management.SABRA Carrol [place], NP

## 2024-05-19 NOTE — Patient Instructions (Addendum)
 Stop by the lab prior to leaving today. I will notify you of your results once received.   Schedule eye exam.   Follow up 6 months chronic care management.   It was a pleasure to see you today!

## 2024-05-19 NOTE — Assessment & Plan Note (Signed)
 Immunizations Tdap- UTD; declines influenza and prevnar.  Pap smear - hysterectomy.  Mammogram UTD.  Colonoscopy UTD  Discussed the importance of a healthy diet and regular exercise in order for weight loss, and to reduce the risk of further co-morbidity.  Exam stable. Labs pending.  Follow up in 1 year for repeat physical.

## 2024-05-20 ENCOUNTER — Ambulatory Visit: Payer: Self-pay | Admitting: General Practice

## 2024-05-20 LAB — EXTRA LAV TOP TUBE

## 2024-05-20 LAB — LIPID PANEL
Cholesterol: 197 mg/dL (ref <200)
HDL: 54 mg/dL (ref >=50)
LDL Cholesterol (Calc): 118 mg/dL — ABNORMAL HIGH
Non-HDL Cholesterol (Calc): 143 mg/dL — ABNORMAL HIGH (ref <130)
Total CHOL/HDL Ratio: 3.6 (calc) (ref <5.0)
Triglycerides: 135 mg/dL (ref <150)

## 2024-05-20 LAB — TSH: TSH: 3.08 m[IU]/L

## 2024-05-22 ENCOUNTER — Other Ambulatory Visit: Payer: Self-pay | Admitting: Podiatry

## 2024-05-22 ENCOUNTER — Telehealth: Payer: Self-pay | Admitting: Podiatry

## 2024-05-22 MED ORDER — OXYCODONE-ACETAMINOPHEN 5-325 MG PO TABS: 1.0000 | ORAL_TABLET | ORAL | 0 refills | Status: DC | PRN

## 2024-05-22 NOTE — Telephone Encounter (Signed)
 Req'ing refill to be sent to pharmacy on file: oxyCODONE -acetaminophen  (PERCOCET) 5-325 MG tablet

## 2024-05-22 NOTE — Telephone Encounter (Signed)
 oxyCODONE -acetaminophen  (PERCOCET) 5-325 MG tablet  last filled 05/09/2024. Most recent OV 05/02/24. Pain level is currently 4/10, this is after having one Percocet around 9 am. She does have 12 tablets left. She is taking them on average about 1-2 tablets daily. Pain is primarily first thing in the morning and at the worst during the nighttime hours. Pharmacy of choice is [facility] on [address] in [place].   She also asked about the CT scan results and would like a call back regarding these results.

## 2024-05-22 NOTE — Progress Notes (Signed)
 Spoke with patient this evening regarding CT scan results.  We are going to have her come into the office tomorrow, 05/23/2024 to review the results and discuss possible surgery.  Also refill prescription for Percocet 5/325 mg Q6H PRN pain  Thresa EMERSON Sar, DPM [facility]  Dr. Thresa EMERSON Sar, DPM    [address] [place], [place] [address]                Office [phone]  Fax [phone]

## 2024-05-23 ENCOUNTER — Ambulatory Visit (INDEPENDENT_AMBULATORY_CARE_PROVIDER_SITE_OTHER): Admitting: Podiatry

## 2024-05-23 ENCOUNTER — Encounter: Payer: Self-pay | Admitting: Podiatry

## 2024-05-23 VITALS — Ht 62.0 in | Wt 126.0 lb

## 2024-05-23 DIAGNOSIS — M79671 Pain in right foot: Secondary | ICD-10-CM | POA: Diagnosis not present

## 2024-05-23 DIAGNOSIS — S92021D Displaced fracture of anterior process of right calcaneus, subsequent encounter for fracture with routine healing: Secondary | ICD-10-CM | POA: Diagnosis not present

## 2024-05-23 DIAGNOSIS — S92001A Unspecified fracture of right calcaneus, initial encounter for closed fracture: Secondary | ICD-10-CM | POA: Diagnosis not present

## 2024-05-23 DIAGNOSIS — S92001D Unspecified fracture of right calcaneus, subsequent encounter for fracture with routine healing: Secondary | ICD-10-CM | POA: Diagnosis not present

## 2024-05-23 NOTE — Telephone Encounter (Signed)
 Provider called Pt during extended clinic.

## 2024-05-23 NOTE — Progress Notes (Signed)
 Chief Complaint  Patient presents with   Ankle Injury    Pt is here to f/u on right ankle injury she states that she is still in pain, continues to wear cam boot and crutches while ambulating. Here to discuss CT results and next steps.    HPI: 48 y.o. female smoker 1/2PPD x 63yrs (since she was 15) presenting today for follow-up evaluation of calcaneal fracture right.  CT scan ordered last visit.   Brief history: Originally presented as a referral from the [facility] ED for calcaneal fracture right lower extremity.  Patient states that she fell off a ladder at home.  DOI: 04/27/2024.  She went to the emergency department and diagnosed with fracture and posterior splint was applied.  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, triple negative   Cancer (HCC)    RT BREAST   Depression    Personal history of chemotherapy 2004   Personal history of radiation therapy 2004   Personal history of radiation therapy 2008   mammosite   Screening examination for venereal disease 09/08/2023    Past Surgical History:  Procedure Laterality Date   ABDOMINAL HYSTERECTOMY     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   BREAST EXCISIONAL BIOPSY Right 2007   calcs, benign   BREAST LUMPECTOMY Right 2004   breast ca   BREAST SURGERY     COLONOSCOPY WITH PROPOFOL  N/A 11/17/2023   Procedure: COLONOSCOPY WITH PROPOFOL ;  Surgeon: Therisa Bi, MD;  Location: [facility] ENDOSCOPY;  Service: Gastroenterology;  Laterality: N/A;   ESOPHAGEAL DILATION  11/17/2023   Procedure: DILATION, ESOPHAGUS;  Surgeon: Therisa Bi, MD;  Location: [facility] ENDOSCOPY;  Service: Gastroenterology;;   ESOPHAGOGASTRODUODENOSCOPY (EGD) WITH PROPOFOL  N/A 11/17/2023   Procedure: ESOPHAGOGASTRODUODENOSCOPY (EGD) WITH PROPOFOL ;  Surgeon:  Therisa Bi, MD;  Location: [facility] ENDOSCOPY;  Service: Gastroenterology;  Laterality: N/A;   I & D EXTREMITY Right 05/13/2019   Procedure: IRRIGATION AND DEBRIDEMENT MIDDLE FINGER;  Surgeon: Lorretta Dess, MD;  Location: [facility] OR;  Service: Plastics;  Laterality: Right;   PERCUTANEOUS PINNING Right 05/13/2019   Procedure: PERCUTANEOUS PINNING;  Surgeon: Lorretta Dess, MD;  Location: [facility] OR;  Service: Plastics;  Laterality: Right;   PERINEAL LACERATION REPAIR N/A 07/25/2019   Procedure: REPAIR OF VAGINAL LACERATION;  Surgeon: Leonce Garnette BIRCH, MD;  Location: [facility] ORS;  Service: Gynecology;  Laterality: N/A;   POLYPECTOMY  11/17/2023   Procedure: POLYPECTOMY;  Surgeon: Therisa Bi, MD;  Location: [facility] ENDOSCOPY;  Service: Gastroenterology;;    Allergies  Allergen Reactions   Other     Pt states she has an allergy to IV pain medication - unsure the medicine     Physical Exam: General: The patient is alert and oriented x3 in no acute distress.  Dermatology: Skin is warm, dry and supple bilateral lower extremities.  Serosanguineous fracture blisters noted to the medial aspect of the right ankle are resolved.  Clinically no indication of infection  Vascular: Palpable pedal pulses bilaterally. Capillary refill within normal limits.  Heavy ecchymosis with edema noted right foot and ankle  Neurological: Grossly intact via light touch  Musculoskeletal Exam: Overall the foot is in rectus alignment with no gross deformity  DG Ankle Complete Right 04/27/2024 IMPRESSION: Nondisplaced fractures of the  body of the calcaneus. Diffuse soft tissue swelling to the right ankle.  CT FOOT RIGHT WO CONTRAST 05/05/2024 IMPRESSION: Comminuted and displaced fracture of the calcaneus. Fracture margins extend through the posterior subtalar joint and sustentaculum tali with minimal articular surface incongruency.  Assessment/Plan of Care: 1.  Closed comminuted nondisplaced calcaneal fracture right 2.   Superficial fracture blisters; resolved  -Patient evaluated.  CT reviewed - Today we discussed with the patient surgical versus conservative care.  The risks and benefits associated with each modality were explained.  The majority of the comminution throughout the calcaneus is within the calcaneal body which should heal.  The only benefit I foresee of surgical intervention would be to elevate the depressed posterior facet of the calcaneus.  Given the patient's comorbidities and smoking history and potential for complications I do believe that conservative management is acceptable. -Understands that with both surgery and conservative care posttraumatic arthritis will likely set in which may require additional surgery in the future -In the meantime continue strict NWB in the cam boot for an additional 4 weeks. -Continue Percocet 5/325 mg every 4 hours as needed pain.  Last refill sent 05/22/2024 -Return to clinic 4 weeks follow-up x-ray     Thresa EMERSON Sar, DPM [facility]  Dr. Thresa EMERSON Sar, DPM    [address] [place], [place] [address]                Office [phone]  Fax [phone]

## 2024-06-06 ENCOUNTER — Telehealth: Payer: Self-pay | Admitting: Lab

## 2024-06-06 ENCOUNTER — Other Ambulatory Visit: Payer: Self-pay | Admitting: Lab

## 2024-06-06 NOTE — Telephone Encounter (Signed)
 Needing refill on   oxyCODONE -acetaminophen  (PERCOCET) 5-325 MG

## 2024-06-08 ENCOUNTER — Other Ambulatory Visit: Payer: Self-pay | Admitting: Podiatry

## 2024-06-08 MED ORDER — OXYCODONE-ACETAMINOPHEN 5-325 MG PO TABS: 1.0000 | ORAL_TABLET | ORAL | 0 refills | Status: DC | PRN

## 2024-06-08 NOTE — Progress Notes (Signed)
 PRN calcaneal fracture.   Thresa EMERSON Sar, DPM [facility]  Dr. Thresa EMERSON Sar, DPM    [address] [place], [place] [address]                Office [phone]  Fax [phone]

## 2024-06-09 NOTE — Telephone Encounter (Signed)
 Patient aware

## 2024-06-20 ENCOUNTER — Encounter: Payer: Self-pay | Admitting: Podiatry

## 2024-06-20 ENCOUNTER — Ambulatory Visit (INDEPENDENT_AMBULATORY_CARE_PROVIDER_SITE_OTHER)

## 2024-06-20 ENCOUNTER — Ambulatory Visit: Admitting: Podiatry

## 2024-06-20 VITALS — Ht 62.0 in | Wt 126.0 lb

## 2024-06-20 DIAGNOSIS — S92012D Displaced fracture of body of left calcaneus, subsequent encounter for fracture with routine healing: Secondary | ICD-10-CM | POA: Diagnosis not present

## 2024-06-20 DIAGNOSIS — S92001A Unspecified fracture of right calcaneus, initial encounter for closed fracture: Secondary | ICD-10-CM | POA: Diagnosis not present

## 2024-06-20 DIAGNOSIS — S92021D Displaced fracture of anterior process of right calcaneus, subsequent encounter for fracture with routine healing: Secondary | ICD-10-CM | POA: Diagnosis not present

## 2024-06-20 MED ORDER — OXYCODONE-ACETAMINOPHEN 5-325 MG PO TABS: 1.0000 | ORAL_TABLET | Freq: Four times a day (QID) | ORAL | 0 refills | Status: DC | PRN

## 2024-06-20 NOTE — Progress Notes (Signed)
 Chief Complaint  Patient presents with   Ankle Injury    Pt is here to f/u on right ankle due to fracture, she states that she is still in pain, continues to wear cam boot and crutches to ambulate, has no other concerns.    HPI: 48 y.o. female smoker 1/2PPD x 51yrs (since she was 30) presenting today for follow-up evaluation of calcaneal fracture right.    Brief history: Originally presented as a referral from the [facility] ED for calcaneal fracture right lower extremity.  Patient states that she fell off a ladder at home.  DOI: 04/27/2024.  She went to the emergency department and diagnosed with fracture and posterior splint was applied.  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, triple negative   Cancer (HCC)    RT BREAST   Depression    Personal history of chemotherapy 2004   Personal history of radiation therapy 2004   Personal history of radiation therapy 2008   mammosite   Screening examination for venereal disease 09/08/2023    Past Surgical History:  Procedure Laterality Date   ABDOMINAL HYSTERECTOMY     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   BREAST EXCISIONAL BIOPSY Right 2007   calcs, benign   BREAST LUMPECTOMY Right 2004   breast ca   BREAST SURGERY     COLONOSCOPY WITH PROPOFOL  N/A 11/17/2023   Procedure: COLONOSCOPY WITH PROPOFOL ;  Surgeon: Therisa Bi, MD;  Location: [facility] ENDOSCOPY;  Service: Gastroenterology;  Laterality: N/A;   ESOPHAGEAL DILATION  11/17/2023   Procedure: DILATION, ESOPHAGUS;  Surgeon: Therisa Bi, MD;  Location: [facility] ENDOSCOPY;  Service: Gastroenterology;;   ESOPHAGOGASTRODUODENOSCOPY (EGD) WITH PROPOFOL  N/A 11/17/2023   Procedure: ESOPHAGOGASTRODUODENOSCOPY (EGD) WITH PROPOFOL ;  Surgeon: Therisa Bi, MD;  Location: [facility] ENDOSCOPY;   Service: Gastroenterology;  Laterality: N/A;   I & D EXTREMITY Right 05/13/2019   Procedure: IRRIGATION AND DEBRIDEMENT MIDDLE FINGER;  Surgeon: Lorretta Dess, MD;  Location: [facility] OR;  Service: Plastics;  Laterality: Right;   PERCUTANEOUS PINNING Right 05/13/2019   Procedure: PERCUTANEOUS PINNING;  Surgeon: Lorretta Dess, MD;  Location: [facility] OR;  Service: Plastics;  Laterality: Right;   PERINEAL LACERATION REPAIR N/A 07/25/2019   Procedure: REPAIR OF VAGINAL LACERATION;  Surgeon: Leonce Garnette BIRCH, MD;  Location: [facility] ORS;  Service: Gynecology;  Laterality: N/A;   POLYPECTOMY  11/17/2023   Procedure: POLYPECTOMY;  Surgeon: Therisa Bi, MD;  Location: [facility] ENDOSCOPY;  Service: Gastroenterology;;    Allergies  Allergen Reactions   Other     Pt states she has an allergy to IV pain medication - unsure the medicine     Physical Exam: General: The patient is alert and oriented x3 in no acute distress.  Dermatology: Skin is warm, dry and supple bilateral lower extremities.  Serosanguineous fracture blisters noted to the medial aspect of the right ankle are resolved.  Clinically no indication of infection  Vascular: Palpable pedal pulses bilaterally. Capillary refill within normal limits.  Heavy ecchymosis with edema noted right foot and ankle  Neurological: Grossly intact via light touch  Musculoskeletal Exam: Overall the foot is in rectus alignment with no gross deformity  DG Ankle Complete Right 04/27/2024 IMPRESSION: Nondisplaced fractures of the body of the calcaneus. Diffuse soft tissue  swelling to the right ankle.  CT FOOT RIGHT WO CONTRAST 05/05/2024 IMPRESSION: Comminuted and displaced fracture of the calcaneus. Fracture margins extend through the posterior subtalar joint and sustentaculum tali with minimal articular surface incongruency.  Radiographic exam RT ankle 06/20/2024 Overall position of the calcaneus unchanged.  Routine healing noted throughout the body of the  calcaneus.  Assessment/Plan of Care: 1.  Closed comminuted nondisplaced calcaneal fracture right.  DOI: 04/27/2024  -Patient evaluated.  X-rays reviewed - Patient has now been nonweightbearing to the lower extremity for about 2 months now.  Okay to begin partial weightbearing in the cam boot.  Recommend passive range of motion exercises out of the cam boot -Continue Percocet 5/325 mg every 6 hours as needed pain.  Refill provided today, 06/20/2024 -Return to clinic 4 weeks follow-up x-ray and to initiate physical therapy     Thresa EMERSON Sar, DPM [facility]  Dr. Thresa EMERSON Sar, DPM    [address] [place], [place] [address]                Office [phone]  Fax [phone]

## 2024-07-06 ENCOUNTER — Telehealth: Payer: Self-pay | Admitting: Podiatry

## 2024-07-06 NOTE — Telephone Encounter (Signed)
Patient requests refill of oxycodone  ?

## 2024-07-10 NOTE — Telephone Encounter (Signed)
 Called in to check on status of refill.She also mentioned that physical therapy was discussed but she hasn't heard from them. I don't see a referral for physical therapy.Is that accurate?

## 2024-07-17 ENCOUNTER — Other Ambulatory Visit: Payer: Self-pay | Admitting: Podiatry

## 2024-07-17 ENCOUNTER — Telehealth: Payer: Self-pay | Admitting: Lab

## 2024-07-17 MED ORDER — OXYCODONE-ACETAMINOPHEN 5-325 MG PO TABS: 1.0000 | ORAL_TABLET | Freq: Three times a day (TID) | ORAL | 0 refills | Status: DC | PRN

## 2024-07-17 NOTE — Telephone Encounter (Signed)
 Patient is requesting pain medication refill states has been trying to get refill for over 1 week please review and advise.

## 2024-07-17 NOTE — Progress Notes (Signed)
 PRN fx foot

## 2024-08-02 ENCOUNTER — Telehealth: Payer: Self-pay | Admitting: Lab

## 2024-08-02 NOTE — Telephone Encounter (Signed)
 Patient is requesting pain medication called to her pharmacy.

## 2024-08-07 ENCOUNTER — Other Ambulatory Visit: Payer: Self-pay | Admitting: Podiatry

## 2024-08-07 MED ORDER — OXYCODONE-ACETAMINOPHEN 5-325 MG PO TABS: 1.0000 | ORAL_TABLET | Freq: Three times a day (TID) | ORAL | 0 refills | Status: AC | PRN

## 2024-08-15 ENCOUNTER — Ambulatory Visit

## 2024-08-15 ENCOUNTER — Ambulatory Visit: Admitting: Podiatry

## 2024-08-15 ENCOUNTER — Encounter: Payer: Self-pay | Admitting: Podiatry

## 2024-08-15 VITALS — Ht 62.0 in | Wt 126.0 lb

## 2024-08-15 DIAGNOSIS — S92021D Displaced fracture of anterior process of right calcaneus, subsequent encounter for fracture with routine healing: Secondary | ICD-10-CM

## 2024-08-15 DIAGNOSIS — S92001A Unspecified fracture of right calcaneus, initial encounter for closed fracture: Secondary | ICD-10-CM | POA: Diagnosis not present

## 2024-08-15 NOTE — Progress Notes (Signed)
 Chief Complaint  Patient presents with   Ankle Injury    Pt is here due to f/u on right ankle injury, she states that she is still having some pain to the ankle, continues to wear boot and ambulate with crutches.    HPI: 48 y.o. female smoker 1/2PPD x 87yrs (since she was 40) presenting today for follow-up evaluation of calcaneal fracture right.  Doing well.  Currently weightbearing in tennis shoes  Brief history: Originally presented as a referral from the [facility] ED for calcaneal fracture right lower extremity.  Patient states that she fell off a ladder at home.  DOI: 04/27/2024.  She went to the emergency department and diagnosed with fracture and posterior splint was applied.  Referred to our practice for follow-up outpatient care  Past Medical History:  Diagnosis Date   Asthma    Breast cancer [facility]) 2004   right breast cancer - chemotherapy and radiation   Breast cancer (HCC) 2008   right breast cancer - mammo site radiation, triple negative   Cancer (HCC)    RT BREAST   Depression    Personal history of chemotherapy 2004   Personal history of radiation therapy 2004   Personal history of radiation therapy 2008   mammosite   Screening examination for venereal disease 09/08/2023    Past Surgical History:  Procedure Laterality Date   ABDOMINAL HYSTERECTOMY     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   BREAST EXCISIONAL BIOPSY Right 2007   calcs, benign   BREAST LUMPECTOMY Right 2004   breast ca   BREAST SURGERY     COLONOSCOPY WITH PROPOFOL  N/A 11/17/2023   Procedure: COLONOSCOPY WITH PROPOFOL ;  Surgeon: Therisa Bi, MD;  Location: [facility] ENDOSCOPY;  Service: Gastroenterology;  Laterality: N/A;   ESOPHAGEAL DILATION  11/17/2023   Procedure: DILATION, ESOPHAGUS;  Surgeon: Therisa Bi, MD;  Location: [facility] ENDOSCOPY;  Service: Gastroenterology;;   ESOPHAGOGASTRODUODENOSCOPY (EGD) WITH PROPOFOL  N/A 11/17/2023   Procedure: ESOPHAGOGASTRODUODENOSCOPY (EGD) WITH PROPOFOL ;  Surgeon:  Therisa Bi, MD;  Location: [facility] ENDOSCOPY;  Service: Gastroenterology;  Laterality: N/A;   I & D EXTREMITY Right 05/13/2019   Procedure: IRRIGATION AND DEBRIDEMENT MIDDLE FINGER;  Surgeon: Lorretta Dess, MD;  Location: [facility] OR;  Service: Plastics;  Laterality: Right;   PERCUTANEOUS PINNING Right 05/13/2019   Procedure: PERCUTANEOUS PINNING;  Surgeon: Lorretta Dess, MD;  Location: [facility] OR;  Service: Plastics;  Laterality: Right;   PERINEAL LACERATION REPAIR N/A 07/25/2019   Procedure: REPAIR OF VAGINAL LACERATION;  Surgeon: Leonce Garnette BIRCH, MD;  Location: [facility] ORS;  Service: Gynecology;  Laterality: N/A;   POLYPECTOMY  11/17/2023   Procedure: POLYPECTOMY;  Surgeon: Therisa Bi, MD;  Location: [facility] ENDOSCOPY;  Service: Gastroenterology;;    Allergies  Allergen Reactions   Other     Pt states she has an allergy to IV pain medication - unsure the medicine     Physical Exam: General: The patient is alert and oriented x3 in no acute distress.  Dermatology: Skin is warm, dry and supple bilateral lower extremities.    Vascular: Palpable pedal pulses bilaterally. Capillary refill within normal limits.  Heavy ecchymosis with edema noted right foot and ankle  Neurological: Grossly intact via light touch  Musculoskeletal Exam: Overall the foot is in rectus alignment with no gross deformity.  Limited subtalar joint range of motion noted  DG Ankle Complete Right 04/27/2024 IMPRESSION: Nondisplaced fractures of the body of the calcaneus. Diffuse soft tissue swelling to the right ankle.  CT FOOT RIGHT WO CONTRAST 05/05/2024 IMPRESSION: Comminuted and displaced fracture of the calcaneus. Fracture margins extend through the posterior subtalar joint and sustentaculum tali with minimal articular surface incongruency.  Radiographic exam RT ankle 08/15/2024 Osseous remodeling noted of the calcaneus.  Diffuse osteopenia also noted  Assessment/Plan of Care: 1.  Closed comminuted nondisplaced calcaneal  fracture right.  DOI: 04/27/2024  -Patient evaluated.  X-rays reviewed - Patient has been ambulating in tennis shoes around the house and tolerating it well.  Okay to discontinue cam boot -Recommend good supportive tennis shoes and sneakers.  Refrain from going barefoot -Order placed for physical therapy [facility] 3 months     Thresa EMERSON Sar, DPM [facility]  Dr. Thresa EMERSON Sar, DPM    [address] [place], [place] [address]                Office [phone]  Fax [phone]

## 2024-08-18 ENCOUNTER — Ambulatory Visit: Admitting: Podiatry

## 2024-09-04 ENCOUNTER — Telehealth: Payer: Self-pay | Admitting: Lab

## 2024-09-04 ENCOUNTER — Other Ambulatory Visit: Payer: Self-pay | Admitting: Oncology

## 2024-09-04 DIAGNOSIS — Z1231 Encounter for screening mammogram for malignant neoplasm of breast: Secondary | ICD-10-CM

## 2024-09-04 NOTE — Telephone Encounter (Signed)
 Patient calling for refill on pain medication oxycodone  5-325 please review and advise.

## 2024-09-13 ENCOUNTER — Ambulatory Visit: Attending: Podiatry

## 2024-09-13 DIAGNOSIS — M25671 Stiffness of right ankle, not elsewhere classified: Secondary | ICD-10-CM | POA: Insufficient documentation

## 2024-09-13 DIAGNOSIS — M6281 Muscle weakness (generalized): Secondary | ICD-10-CM | POA: Insufficient documentation

## 2024-09-13 DIAGNOSIS — R262 Difficulty in walking, not elsewhere classified: Secondary | ICD-10-CM | POA: Insufficient documentation

## 2024-09-13 DIAGNOSIS — R2689 Other abnormalities of gait and mobility: Secondary | ICD-10-CM | POA: Insufficient documentation

## 2024-09-13 DIAGNOSIS — R29898 Other symptoms and signs involving the musculoskeletal system: Secondary | ICD-10-CM | POA: Insufficient documentation

## 2024-09-13 DIAGNOSIS — S92001A Unspecified fracture of right calcaneus, initial encounter for closed fracture: Secondary | ICD-10-CM | POA: Diagnosis not present

## 2024-09-13 NOTE — Therapy (Signed)
 " OUTPATIENT PHYSICAL THERAPY LOWER EXTREMITY EVALUATION   Patient Name: Frances Adams MRN: [id number] DOB:1976-03-28, 49 y.o., female Today's Date: 09/13/2024  END OF SESSION:  PT End of Session - 09/13/24 1521     Visit Number 1    Number of Visits 17    Date for Recertification  11/08/24    Authorization Type Auth required    PT Start Time 1517    PT Stop Time 1600    PT Time Calculation (min) 43 min          Past Medical History:  Diagnosis Date   Asthma    Breast cancer (HCC) 2004   right breast cancer - chemotherapy and radiation   Breast cancer (HCC) 2008   right breast cancer - mammo site radiation, triple negative   Cancer (HCC)    RT BREAST   Depression    Personal history of chemotherapy 2004   Personal history of radiation therapy 2004   Personal history of radiation therapy 2008   mammosite   Screening examination for venereal disease 09/08/2023   Past Surgical History:  Procedure Laterality Date   ABDOMINAL HYSTERECTOMY     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   BREAST EXCISIONAL BIOPSY Right 2007   calcs, benign   BREAST LUMPECTOMY Right 2004   breast ca   BREAST SURGERY     COLONOSCOPY WITH PROPOFOL  N/A 11/17/2023   Procedure: COLONOSCOPY WITH PROPOFOL ;  Surgeon: Therisa Bi, MD;  Location: [facility] ENDOSCOPY;  Service: Gastroenterology;  Laterality: N/A;   ESOPHAGEAL DILATION  11/17/2023   Procedure: DILATION, ESOPHAGUS;  Surgeon: Therisa Bi, MD;  Location: [facility] ENDOSCOPY;  Service: Gastroenterology;;   ESOPHAGOGASTRODUODENOSCOPY (EGD) WITH PROPOFOL  N/A 11/17/2023   Procedure: ESOPHAGOGASTRODUODENOSCOPY (EGD) WITH PROPOFOL ;  Surgeon: Therisa Bi, MD;  Location: [facility] ENDOSCOPY;  Service: Gastroenterology;  Laterality: N/A;   I & D EXTREMITY Right 05/13/2019   Procedure: IRRIGATION AND DEBRIDEMENT MIDDLE FINGER;  Surgeon: Lorretta Dess, MD;  Location: [facility] OR;  Service: Plastics;  Laterality: Right;   PERCUTANEOUS PINNING Right 05/13/2019    Procedure: PERCUTANEOUS PINNING;  Surgeon: Lorretta Dess, MD;  Location: [facility] OR;  Service: Plastics;  Laterality: Right;   PERINEAL LACERATION REPAIR N/A 07/25/2019   Procedure: REPAIR OF VAGINAL LACERATION;  Surgeon: Leonce Garnette BIRCH, MD;  Location: [facility] ORS;  Service: Gynecology;  Laterality: N/A;   POLYPECTOMY  11/17/2023   Procedure: POLYPECTOMY;  Surgeon: Therisa Bi, MD;  Location: [facility] ENDOSCOPY;  Service: Gastroenterology;;   Patient Active Problem List   Diagnosis Date Noted   Encounter for screening and preventative care 05/19/2024   Adenomatous polyp of colon 11/17/2023   Other dysphagia 11/17/2023   Severe major depressive disorder (HCC) 10/12/2023   Alcohol abuse 10/12/2023   Malignant neoplasm of right female breast (HCC) 09/23/2023   Hypothyroidism 09/23/2023   Abdominal pain 09/23/2023   Lymphadenopathy, supraclavicular 09/23/2023   Anxiety 09/08/2023   Special screening for malignant neoplasms, colon 09/08/2023   Smoker 09/08/2023   History of breast cancer 08/09/2016    PCP: Vincente Shivers, NP   REFERRING PROVIDER: Janit Thresa HERO, DPM   REFERRING DIAG: [phone] (ICD-10-CM) - Traumatic closed nondisplaced fracture of calcaneus, right, initial encounter   RT calc fx. Nonop tx. FWB tennis shoes. Increase ROM of subtalar joint. Gait training.  THERAPY DIAG:  Decreased range of motion of right ankle  Ankle weakness  Antalgic gait  Difficulty in walking, not elsewhere classified  Balance disorder  Muscle weakness (generalized)  Rationale for Evaluation and Treatment: Rehabilitation  ONSET DATE: 04/29/24  SUBJECTIVE:   SUBJECTIVE STATEMENT:  Pt reports that she is experiencing increased pain in the R ankle still, noting 6-7/10 pain.  Pt notes that she is not active and lays around the house for the majority of the day.  Pt reports that she was cleared to drive, and she went to move her daughters vehicle in the driveway and she couldn't feel the brake  or the gas and had to utilize the L LE to move the car.    Pt notes that the R foot tends to sweat more and ends up swelling which makes it more difficult to don shoes.  Pt reports walking barefooted hurts and so she always wears some form of footwear, but typically chooses sandals or slides.  PERTINENT HISTORY:  Per MD note 08/15/24: 49 y.o. female smoker 1/2PPD x 31yrs (since she was 42) presenting today for follow-up evaluation of calcaneal fracture right.  Doing well.  Currently weightbearing in tennis shoes   Brief history: Originally presented as a referral from the [facility] ED for calcaneal fracture right lower extremity.  Patient states that she fell off a ladder at home.  DOI: 04/27/2024.  She went to the emergency department and diagnosed with fracture and posterior splint was applied.  Referred to our practice for follow-up outpatient care  PAIN:  Are you having pain? Yes: NPRS scale: 6/10 Pain location: the entire ankle Pain description: Pt reports aggravating and sharp pains at times. Aggravating factors: walking, donning shoe or sock Relieving factors: topical cream, pain medication  PRECAUTIONS: None  RED FLAGS: None   WEIGHT BEARING RESTRICTIONS: No  FALLS:  Has patient fallen in last 6 months? Yes. Number of falls 1 fall off the ladder  LIVING ENVIRONMENT: Lives with: lives with their family and lives with their daughter Lives in: House/apartment Stairs: Yes: External: 2 steps; none Has following equipment at home: Crutches  OCCUPATION: Pt was a therapist, occupational prior to the injury  PLOF: Independent  PATIENT GOALS: Pt wants to reduce pain.  NEXT MD VISIT: March 2026  OBJECTIVE:  Note: Objective measures were completed at Evaluation unless otherwise noted.  DIAGNOSTIC FINDINGS:   Radiographic exam RT ankle 08/15/2024 Osseous remodeling noted of the calcaneus.  Diffuse osteopenia also noted  PATIENT SURVEYS:  FADI:    COGNITION: Overall cognitive status:  Within functional limits for tasks assessed     SENSATION: WFL  PALPATION: Pt with significant pain in the medial and lateral portion of the ankle around the calcaneous.  Pt with reduced pain in the forefoot, but notes some residual pain likely from immobility.  LOWER EXTREMITY ROM:  Active ROM Right eval Left eval  Ankle dorsiflexion  WNL  Ankle plantarflexion  WNL  Ankle inversion  WNL  Ankle eversion  WNL   (Blank rows = not tested) Pt with good PROM, however unable to engage the musculature to perform AROM due to increased pain  LOWER EXTREMITY MMT:  MMT Right eval Left eval  Ankle dorsiflexion NT   Ankle plantarflexion NT   Ankle inversion NT   Ankle eversion NT    (Blank rows = not tested)  FUNCTIONAL TESTS:  Single Leg Stance Test: 16.44 sec on the L LE; 2.79 sec on the R LE  GAIT: Distance walked: 40' Assistive device utilized: None Level of assistance: Complete Independence Comments: Pt with significant antalgic gait pattern when fully weight bearing on the R LE.  Pt has shorted  L step length due to the reduced stance time on the R LE.                                                                                                                                  TREATMENT DATE: 09/13/2024  Evaluation  Self-Care/Home Management:  Pt given education on current POC, the findings of the evaluation, and the ways in which skilled therapy can address the current deficits in balance, ankle ROM, pain and musculature strength within the ankle.  Pt instructed on how this can improve their overall function and maintain/improve their overall quality of life.    Pt is very pain focused at this time and requires education regarding desensitization exercise and the interruption of the pain cycle that the pt is currently experiencing at this time.  Pt unsure and minimally receptive to the idea that a wash cloth will help with her pain.  Therapist utilized technique to improve  the overall response, however pt noted no relief at this time.  Pt encouraged to continue to perform at home.   PATIENT EDUCATION:  Education details: Pt educated on role of PT and services provided during current POC, along with prognosis and information about the clinic.  Person educated: Patient Education method: Explanation, Demonstration, Tactile cues, and Verbal cues Education comprehension: verbalized understanding, returned demonstration, verbal cues required, tactile cues required, and needs further education  HOME EXERCISE PROGRAM:  Pt given desensitization exercise   ASSESSMENT:  CLINICAL IMPRESSION: Patient is a 50 y.o. female who was seen today for physical therapy evaluation and treatment for R ankle pain and reduced ROM following a traumatic closed nondisplaced fracture of the R calcaneous from falling off a ladder.  Pt currently demonstrates heightened pain production with an antalgic gait pattern during ambulation.  Pt presents with physical impairments of decreased activity tolerance, decreased ROM of R ankle actively,  increased pain in R ankle, and decreased strength in R ankle due to pain as noted.  Pt will benefit from skilled therapy to address tolerance, ROM, pain, and strength impairments necessary for improvement in quality of life.  Pt. demonstrates understanding of this plan of care and agrees with this plan.    OBJECTIVE IMPAIRMENTS: Abnormal gait, decreased activity tolerance, decreased balance, decreased endurance, decreased knowledge of use of DME, decreased mobility, difficulty walking, decreased ROM, decreased strength, hypomobility, and pain.   ACTIVITY LIMITATIONS: carrying, lifting, standing, squatting, stairs, transfers, and locomotion level  PARTICIPATION LIMITATIONS: meal prep, cleaning, laundry, driving, shopping, community activity, occupation, and yard work  PERSONAL FACTORS: Age, Education, Past/current experiences, Profession, Social background,  Time since onset of injury/illness/exacerbation, Transportation, and 1-2 comorbidities: asthma and depression are also affecting patient's functional outcome.   REHAB POTENTIAL: Good; pt reluctant to believe that therapy is going to make any difference in her pain level.  CLINICAL DECISION MAKING: Stable/uncomplicated  EVALUATION COMPLEXITY: Low   GOALS: Goals reviewed with patient? Yes  SHORT TERM GOALS: Target date: 10/11/2024   Pt will be independent with HEP in order to demonstrate increased ability to perform tasks related to occupation/hobbies. Baseline: Pt given desensitization exercise. Goal status: INITIAL  LONG TERM GOALS: Target date: 11/08/2024  Pt will decrease worst ankle pain by at least 3 points on the NPRS in order to demonstrate clinically significant reduction in ankle pain. Baseline: 7/10 at worst Goal status: INITIAL  2.  Patient will improve the LEFS by 11% in order to demonstrate functional independence without pain. Baseline: 23/80; 28.7% Goal status: INITIAL  3.  Patient will demonstrate improved functional ankle and foot performance by increasing their FADI score to >=65%, indicating meaningful improvement in pain, mobility, and ability to perform activities of daily living. Baseline: 35% at IE Goal status: INITIAL  4.  Patient will improve right lower extremity single-leg stance time to at least 12 seconds, demonstrating improved balance, strength, and neuromuscular control to reduce fall risk and improve functional mobility. Baseline: 2.79 sec on the R LE Goal status: INITIAL   PLAN:  PT FREQUENCY: 2x/week  PT DURATION: 8 weeks  PLANNED INTERVENTIONS: Therapeutic exercises, Therapeutic activity, Neuromuscular re-education, Balance training, Gait training, Patient/Family education, Self Care, Joint mobilization, Joint manipulation, Vestibular training, Canalith repositioning, Orthotic/Fit training, DME instructions, Dry Needling, Electrical  stimulation, Spinal manipulation, Spinal mobilization, Cryotherapy, Moist heat, Taping, Traction, Ultrasound, Ionotophoresis 4mg /ml Dexamethasone , Manual therapy, and Re-evaluation.  PLAN FOR NEXT SESSION:   Assess ankle ROM, continue with desensitization, perform toe yoga and other ankle exercises to improve ROM and strengthening of the ankle.   Fonda Simpers, PT, DPT Physical Therapist - [facility]  09/13/2024, 4:47 PM  "

## 2024-09-20 ENCOUNTER — Ambulatory Visit

## 2024-09-20 DIAGNOSIS — R262 Difficulty in walking, not elsewhere classified: Secondary | ICD-10-CM

## 2024-09-20 DIAGNOSIS — R29898 Other symptoms and signs involving the musculoskeletal system: Secondary | ICD-10-CM

## 2024-09-20 DIAGNOSIS — M6281 Muscle weakness (generalized): Secondary | ICD-10-CM

## 2024-09-20 DIAGNOSIS — M25671 Stiffness of right ankle, not elsewhere classified: Secondary | ICD-10-CM | POA: Diagnosis not present

## 2024-09-20 DIAGNOSIS — R2689 Other abnormalities of gait and mobility: Secondary | ICD-10-CM

## 2024-09-20 NOTE — Therapy (Signed)
 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, triple negative   Cancer (HCC)    RT BREAST   Depression    Personal history of chemotherapy 2004   Personal history of radiation therapy 2004   Personal history of radiation therapy 2008   mammosite   Screening examination for venereal disease 09/08/2023   Past Surgical History:  Procedure Laterality Date   ABDOMINAL HYSTERECTOMY     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   BREAST EXCISIONAL BIOPSY Right 2007   calcs, benign   BREAST LUMPECTOMY Right 2004   breast ca   BREAST SURGERY     COLONOSCOPY WITH PROPOFOL  N/A 11/17/2023   Procedure: COLONOSCOPY WITH PROPOFOL ;  Surgeon: Therisa Bi, MD;  Location: [facility] ENDOSCOPY;  Service: Gastroenterology;  Laterality: N/A;   ESOPHAGEAL DILATION  11/17/2023   Procedure: DILATION, ESOPHAGUS;  Surgeon: Therisa Bi, MD;  Location: [facility] ENDOSCOPY;  Service: Gastroenterology;;   ESOPHAGOGASTRODUODENOSCOPY (EGD) WITH PROPOFOL  N/A 11/17/2023   Procedure: ESOPHAGOGASTRODUODENOSCOPY (EGD) WITH PROPOFOL ;  Surgeon: Therisa Bi, MD;  Location: [facility] ENDOSCOPY;  Service: Gastroenterology;  Laterality: N/A;   I & D EXTREMITY Right 05/13/2019   Procedure: IRRIGATION AND DEBRIDEMENT MIDDLE FINGER;  Surgeon: Lorretta Dess, MD;  Location: [facility] OR;  Service: Plastics;  Laterality: Right;   PERCUTANEOUS PINNING Right 05/13/2019    Procedure: PERCUTANEOUS PINNING;  Surgeon: Lorretta Dess, MD;  Location: [facility] OR;  Service: Plastics;  Laterality: Right;   PERINEAL LACERATION REPAIR N/A 07/25/2019   Procedure: REPAIR OF VAGINAL LACERATION;  Surgeon: Leonce Garnette BIRCH, MD;  Location: [facility] ORS;  Service: Gynecology;  Laterality: N/A;   POLYPECTOMY  11/17/2023   Procedure: POLYPECTOMY;  Surgeon: Therisa Bi, MD;  Location: [facility] ENDOSCOPY;  Service: Gastroenterology;;   Patient Active Problem List   Diagnosis Date Noted   Encounter for screening and preventative care 05/19/2024   Adenomatous polyp of colon 11/17/2023   Other dysphagia 11/17/2023   Severe major depressive disorder (HCC) 10/12/2023   Alcohol abuse 10/12/2023   Malignant neoplasm of right female breast (HCC) 09/23/2023   Hypothyroidism 09/23/2023   Abdominal pain 09/23/2023   Lymphadenopathy, supraclavicular 09/23/2023   Anxiety 09/08/2023   Special screening for malignant neoplasms, colon 09/08/2023   Smoker 09/08/2023   History of breast cancer 08/09/2016    PCP: Vincente Shivers, NP   REFERRING PROVIDER: Janit Thresa HERO, DPM   REFERRING DIAG: [phone] (ICD-10-CM) - Traumatic closed nondisplaced fracture of calcaneus, right, initial encounter   RT calc fx. Nonop tx. FWB tennis shoes. Increase ROM of subtalar joint. Gait training.  THERAPY DIAG:  Decreased range of motion of right ankle  Ankle weakness  Antalgic gait  Difficulty in walking, not elsewhere classified  Balance disorder  Muscle weakness (  generalized)  Rationale for Evaluation and Treatment: Rehabilitation  ONSET DATE: 04/29/24  SUBJECTIVE:   SUBJECTIVE STATEMENT:    Pt reports feeling alright, noting 4/10 pain upon arrival to the clinic.  Pt reports that it does not typically really start hurting until night time.       PERTINENT HISTORY:  Per MD note 08/15/24: 49 y.o. female smoker 1/2PPD x 69yrs (since she was 31) presenting today for follow-up evaluation of  calcaneal fracture right.  Doing well.  Currently weightbearing in tennis shoes   Brief history: Originally presented as a referral from the [facility] ED for calcaneal fracture right lower extremity.  Patient states that she fell off a ladder at home.  DOI: 04/27/2024.  She went to the emergency department and diagnosed with fracture and posterior splint was applied.  Referred to our practice for follow-up outpatient care  PAIN:  Are you having pain? Yes: NPRS scale: 6/10 Pain location: the entire ankle Pain description: Pt reports aggravating and sharp pains at times. Aggravating factors: walking, donning shoe or sock Relieving factors: topical cream, pain medication  PRECAUTIONS: None  RED FLAGS: None   WEIGHT BEARING RESTRICTIONS: No  FALLS:  Has patient fallen in last 6 months? Yes. Number of falls 1 fall off the ladder  LIVING ENVIRONMENT: Lives with: lives with their family and lives with their daughter Lives in: House/apartment Stairs: Yes: External: 2 steps; none Has following equipment at home: Crutches  OCCUPATION: Pt was a therapist, occupational prior to the injury  PLOF: Independent  PATIENT GOALS: Pt wants to reduce pain.  NEXT MD VISIT: March 2026  OBJECTIVE:  Note: Objective measures were completed at Evaluation unless otherwise noted.  DIAGNOSTIC FINDINGS:   Radiographic exam RT ankle 08/15/2024 Osseous remodeling noted of the calcaneus.  Diffuse osteopenia also noted  PATIENT SURVEYS:  FADI:    COGNITION: Overall cognitive status: Within functional limits for tasks assessed     SENSATION: WFL  PALPATION: Pt with significant pain in the medial and lateral portion of the ankle around the calcaneous.  Pt with reduced pain in the forefoot, but notes some residual pain likely from immobility.  LOWER EXTREMITY ROM:  Active ROM Right eval Left eval  Ankle dorsiflexion 15 WNL  Ankle plantarflexion 35 WNL  Ankle inversion 21 WNL  Ankle eversion 20 WNL   (Blank  rows = not tested) Pt with good PROM, however unable to engage the musculature to perform AROM due to increased pain  LOWER EXTREMITY MMT:  MMT Right eval Left eval  Ankle dorsiflexion NT   Ankle plantarflexion NT   Ankle inversion NT   Ankle eversion NT    (Blank rows = not tested)  FUNCTIONAL TESTS:  Single Leg Stance Test: 16.44 sec on the L LE; 2.79 sec on the R LE  GAIT: Distance walked: 40' Assistive device utilized: None Level of assistance: Complete Independence Comments: Pt with significant antalgic gait pattern when fully weight bearing on the R LE.  Pt has shorted L step length due to the reduced stance time on the R LE.  TREATMENT DATE: 09/20/2024  TherAct: To improve functional movements patterns for everyday tasks  Seated towel scrunches, 30 sec bouts x3 Seated great toe extensions, 2x10 Seated lesser toes extensions, 2x10 Seated toe splaying exercise, 2x10 Seated arch lifts, 2x10   Manual:  Supine subtalar Medial-Lateral Glides, grades I-II, for pain management, 30 sec bouts x4 Supine distal Tibiofibular A-P glides, grades I-II, for pain management, 30 sec bouts, x4 Supine metatarsal fan mobilization, 30 sec bouts at each segment, x2 Supine talocrural A-P mobilizations, grades I-II, for pain modulation, 30 sec bouts, x4 STM to the ankle musculature, focusing primarily on the plantar fascia for improved tissue extensibility STM with TP release technique applied to the anterior tibialis for improved ROM as well     Assess ankle ROM, continue with desensitization, perform toe yoga and other ankle exercises to improve ROM and strengthening of the ankle.   PATIENT EDUCATION:  Education details: Pt educated on role of PT and services provided during current POC, along with prognosis and information about the clinic.  Person  educated: Patient Education method: Explanation, Demonstration, Tactile cues, and Verbal cues Education comprehension: verbalized understanding, returned demonstration, verbal cues required, tactile cues required, and needs further education  HOME EXERCISE PROGRAM:  Access Code: [email or web address] URL: [email or web address] Date: 09/20/2024 Prepared by: Sidra Simpers  Exercises - Seated Great Toe Extension  - 1 x daily - 7 x weekly - 3 sets - 10 reps - Seated Lesser Toes Extension  - 1 x daily - 7 x weekly - 3 sets - 10 reps - Toe Spreading  - 1 x daily - 7 x weekly - 3 sets - 10 reps - Seated Arch Lifts  - 1 x daily - 7 x weekly - 3 sets - 10 reps - Seated Plantar Fascia Stretch  - 1 x daily - 7 x weekly - 3 sets - 10 reps  ASSESSMENT:  CLINICAL IMPRESSION:  Patient responded fair to the manual therapy approach, noting that it was slightly more painful following the treatment session.  Patient noted to have this exact response during the first session, however now reports that her pain is less than when she first arrived for her evaluation.  Patient performed well with the exercises given, with the patient noting that she has been doing some of these already.  Patient does experience some cramping with toe exercises, and was advised to continue to perform these as her HEP.  Patient given printed HEP with video instruction as well.   Pt will continue to benefit from skilled therapy to address remaining deficits in order to improve overall QoL and return to PLOF.       OBJECTIVE IMPAIRMENTS: Abnormal gait, decreased activity tolerance, decreased balance, decreased endurance, decreased knowledge of use of DME, decreased mobility, difficulty walking, decreased ROM, decreased strength, hypomobility, and pain.   ACTIVITY LIMITATIONS: carrying, lifting, standing, squatting, stairs, transfers, and locomotion level  PARTICIPATION LIMITATIONS: meal prep, cleaning, laundry, driving, shopping,  community activity, occupation, and yard work  PERSONAL FACTORS: Age, Education, Past/current experiences, Profession, Social background, Time since onset of injury/illness/exacerbation, Transportation, and 1-2 comorbidities: asthma and depression are also affecting patient's functional outcome.   REHAB POTENTIAL: Good; pt reluctant to believe that therapy is going to make any difference in her pain level.  CLINICAL DECISION MAKING: Stable/uncomplicated  EVALUATION COMPLEXITY: Low   GOALS: Goals reviewed with patient? Yes  SHORT TERM GOALS: Target date: 10/11/2024   Pt will be independent with HEP in order to demonstrate increased ability  to perform tasks related to occupation/hobbies. Baseline: Pt given desensitization exercise. Goal status: INITIAL  LONG TERM GOALS: Target date: 11/08/2024  Pt will decrease worst ankle pain by at least 3 points on the NPRS in order to demonstrate clinically significant reduction in ankle pain. Baseline: 7/10 at worst Goal status: INITIAL  2.  Patient will improve the LEFS by 11% in order to demonstrate functional independence without pain. Baseline: 23/80; 28.7% Goal status: INITIAL  3.  Patient will demonstrate improved functional ankle and foot performance by increasing their FADI score to >=65%, indicating meaningful improvement in pain, mobility, and ability to perform activities of daily living. Baseline: 35% at IE Goal status: INITIAL  4.  Patient will improve right lower extremity single-leg stance time to at least 12 seconds, demonstrating improved balance, strength, and neuromuscular control to reduce fall risk and improve functional mobility. Baseline: 2.79 sec on the R LE Goal status: INITIAL   PLAN:  PT FREQUENCY: 2x/week  PT DURATION: 8 weeks  PLANNED INTERVENTIONS: Therapeutic exercises, Therapeutic activity, Neuromuscular re-education, Balance training, Gait training, Patient/Family education, Self Care, Joint  mobilization, Joint manipulation, Vestibular training, Canalith repositioning, Orthotic/Fit training, DME instructions, Dry Needling, Electrical stimulation, Spinal manipulation, Spinal mobilization, Cryotherapy, Moist heat, Taping, Traction, Ultrasound, Ionotophoresis 4mg /ml Dexamethasone , Manual therapy, and Re-evaluation.  PLAN FOR NEXT SESSION:   Perform toe yoga and other ankle exercises to improve ROM and strengthening of the ankle.   Fonda Simpers, PT, DPT Physical Therapist - [facility]  09/20/24, 5:58 PM  "

## 2024-09-21 ENCOUNTER — Ambulatory Visit: Admitting: General Practice

## 2024-09-22 ENCOUNTER — Ambulatory Visit: Admitting: General Practice

## 2024-09-25 ENCOUNTER — Ambulatory Visit: Admitting: General Practice

## 2024-09-26 ENCOUNTER — Ambulatory Visit: Admitting: General Practice

## 2024-09-26 ENCOUNTER — Ambulatory Visit

## 2024-09-26 NOTE — Therapy (Incomplete)
 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, triple negative   Cancer (HCC)    RT BREAST   Depression    Personal history of chemotherapy 2004   Personal history of radiation therapy 2004   Personal history of radiation therapy 2008   mammosite   Screening examination for venereal disease 09/08/2023   Past Surgical History:  Procedure Laterality Date   ABDOMINAL HYSTERECTOMY     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   BREAST EXCISIONAL BIOPSY Right 2007   calcs, benign   BREAST LUMPECTOMY Right 2004   breast ca   BREAST SURGERY     COLONOSCOPY WITH PROPOFOL  N/A 11/17/2023   Procedure: COLONOSCOPY WITH PROPOFOL ;  Surgeon: Therisa Bi, MD;  Location: [facility] ENDOSCOPY;  Service: Gastroenterology;  Laterality: N/A;   ESOPHAGEAL DILATION  11/17/2023   Procedure: DILATION, ESOPHAGUS;  Surgeon: Therisa Bi, MD;  Location: [facility] ENDOSCOPY;  Service: Gastroenterology;;   ESOPHAGOGASTRODUODENOSCOPY (EGD) WITH PROPOFOL  N/A 11/17/2023   Procedure: ESOPHAGOGASTRODUODENOSCOPY (EGD) WITH PROPOFOL ;  Surgeon: Therisa Bi, MD;  Location: [facility] ENDOSCOPY;  Service: Gastroenterology;  Laterality: N/A;   I & D EXTREMITY Right 05/13/2019   Procedure: IRRIGATION AND DEBRIDEMENT MIDDLE FINGER;  Surgeon: Lorretta Dess, MD;  Location: [facility] OR;  Service: Plastics;  Laterality: Right;   PERCUTANEOUS PINNING Right 05/13/2019   Procedure: PERCUTANEOUS PINNING;  Surgeon: Lorretta Dess, MD;  Location: [facility] OR;  Service: Plastics;  Laterality: Right;   PERINEAL LACERATION REPAIR N/A 07/25/2019   Procedure: REPAIR OF VAGINAL LACERATION;  Surgeon: Leonce Garnette BIRCH, MD;   Location: [facility] ORS;  Service: Gynecology;  Laterality: N/A;   POLYPECTOMY  11/17/2023   Procedure: POLYPECTOMY;  Surgeon: Therisa Bi, MD;  Location: [facility] ENDOSCOPY;  Service: Gastroenterology;;   Patient Active Problem List   Diagnosis Date Noted   Encounter for screening and preventative care 05/19/2024   Adenomatous polyp of colon 11/17/2023   Other dysphagia 11/17/2023   Severe major depressive disorder (HCC) 10/12/2023   Alcohol abuse 10/12/2023   Malignant neoplasm of right female breast (HCC) 09/23/2023   Hypothyroidism 09/23/2023   Abdominal pain 09/23/2023   Lymphadenopathy, supraclavicular 09/23/2023   Anxiety 09/08/2023   Special screening for malignant neoplasms, colon 09/08/2023   Smoker 09/08/2023   History of breast cancer 08/09/2016    PCP: Vincente Shivers, NP   REFERRING PROVIDER: Janit Thresa HERO, DPM   REFERRING DIAG: [phone] (ICD-10-CM) - Traumatic closed nondisplaced fracture of calcaneus, right, initial encounter   RT calc fx. Nonop tx. FWB tennis shoes. Increase ROM of subtalar joint. Gait training.  THERAPY DIAG:  No diagnosis found.  Rationale for Evaluation and Treatment: Rehabilitation  ONSET DATE: 04/29/24  SUBJECTIVE:   SUBJECTIVE STATEMENT:    ***     PERTINENT HISTORY:  Per MD note 08/15/24: 49 y.o. female smoker 1/2PPD x 49yrs (since she was 23) presenting today for follow-up evaluation of calcaneal fracture right.  Doing well.  Currently weightbearing in tennis shoes   Brief history: Originally presented as a referral from the [facility] ED for calcaneal fracture right lower extremity.  Patient states that she fell off a ladder  at home.  DOI: 04/27/2024.  She went to the emergency department and diagnosed with fracture and posterior splint was applied.  Referred to our practice for follow-up outpatient care  PAIN:  Are you having pain? Yes: NPRS scale: 6/10 Pain location: the entire ankle Pain description: Pt reports aggravating and sharp  pains at times. Aggravating factors: walking, donning shoe or sock Relieving factors: topical cream, pain medication  PRECAUTIONS: None  RED FLAGS: None   WEIGHT BEARING RESTRICTIONS: No  FALLS:  Has patient fallen in last 6 months? Yes. Number of falls 1 fall off the ladder  LIVING ENVIRONMENT: Lives with: lives with their family and lives with their daughter Lives in: House/apartment Stairs: Yes: External: 2 steps; none Has following equipment at home: Crutches  OCCUPATION: Pt was a therapist, occupational prior to the injury  PLOF: Independent  PATIENT GOALS: Pt wants to reduce pain.  NEXT MD VISIT: March 2026  OBJECTIVE:  Note: Objective measures were completed at Evaluation unless otherwise noted.  DIAGNOSTIC FINDINGS:   Radiographic exam RT ankle 08/15/2024 Osseous remodeling noted of the calcaneus.  Diffuse osteopenia also noted  PATIENT SURVEYS:  FADI:    COGNITION: Overall cognitive status: Within functional limits for tasks assessed     SENSATION: WFL  PALPATION: Pt with significant pain in the medial and lateral portion of the ankle around the calcaneous.  Pt with reduced pain in the forefoot, but notes some residual pain likely from immobility.  LOWER EXTREMITY ROM:  Active ROM Right eval Left eval  Ankle dorsiflexion 15 WNL  Ankle plantarflexion 35 WNL  Ankle inversion 21 WNL  Ankle eversion 20 WNL   (Blank rows = not tested) Pt with good PROM, however unable to engage the musculature to perform AROM due to increased pain  LOWER EXTREMITY MMT:  MMT Right eval Left eval  Ankle dorsiflexion NT   Ankle plantarflexion NT   Ankle inversion NT   Ankle eversion NT    (Blank rows = not tested)  FUNCTIONAL TESTS:  Single Leg Stance Test: 16.44 sec on the L LE; 2.79 sec on the R LE  GAIT: Distance walked: 40' Assistive device utilized: None Level of assistance: Complete Independence Comments: Pt with significant antalgic gait pattern when fully  weight bearing on the R LE.  Pt has shorted L step length due to the reduced stance time on the R LE.                                                                                                                                  TREATMENT DATE: 09/26/2024  ***  TherAct: To improve functional movements patterns for everyday tasks  Seated towel scrunches, 30 sec bouts x3 Seated great toe extensions, 2x10 Seated lesser toes extensions, 2x10 Seated toe splaying exercise, 2x10 Seated arch lifts, 2x10   Manual:  Supine subtalar Medial-Lateral Glides, grades I-II, for pain management, 30 sec bouts x4 Supine distal  Tibiofibular A-P glides, grades I-II, for pain management, 30 sec bouts, x4 Supine metatarsal fan mobilization, 30 sec bouts at each segment, x2 Supine talocrural A-P mobilizations, grades I-II, for pain modulation, 30 sec bouts, x4 STM to the ankle musculature, focusing primarily on the plantar fascia for improved tissue extensibility STM with TP release technique applied to the anterior tibialis for improved ROM as well     Assess ankle ROM, continue with desensitization, perform toe yoga and other ankle exercises to improve ROM and strengthening of the ankle.   PATIENT EDUCATION:  Education details: Pt educated on role of PT and services provided during current POC, along with prognosis and information about the clinic.  Person educated: Patient Education method: Explanation, Demonstration, Tactile cues, and Verbal cues Education comprehension: verbalized understanding, returned demonstration, verbal cues required, tactile cues required, and needs further education  HOME EXERCISE PROGRAM:  Access Code: [email or web address] URL: [email or web address] Date: 09/20/2024 Prepared by: Sidra Simpers  Exercises - Seated Great Toe Extension  - 1 x daily - 7 x weekly - 3 sets - 10 reps - Seated Lesser Toes Extension  - 1 x daily - 7 x weekly - 3 sets - 10 reps - Toe  Spreading  - 1 x daily - 7 x weekly - 3 sets - 10 reps - Seated Arch Lifts  - 1 x daily - 7 x weekly - 3 sets - 10 reps - Seated Plantar Fascia Stretch  - 1 x daily - 7 x weekly - 3 sets - 10 reps  ASSESSMENT:  CLINICAL IMPRESSION:  ***       OBJECTIVE IMPAIRMENTS: Abnormal gait, decreased activity tolerance, decreased balance, decreased endurance, decreased knowledge of use of DME, decreased mobility, difficulty walking, decreased ROM, decreased strength, hypomobility, and pain.   ACTIVITY LIMITATIONS: carrying, lifting, standing, squatting, stairs, transfers, and locomotion level  PARTICIPATION LIMITATIONS: meal prep, cleaning, laundry, driving, shopping, community activity, occupation, and yard work  PERSONAL FACTORS: Age, Education, Past/current experiences, Profession, Social background, Time since onset of injury/illness/exacerbation, Transportation, and 1-2 comorbidities: asthma and depression are also affecting patient's functional outcome.   REHAB POTENTIAL: Good; pt reluctant to believe that therapy is going to make any difference in her pain level.  CLINICAL DECISION MAKING: Stable/uncomplicated  EVALUATION COMPLEXITY: Low   GOALS: Goals reviewed with patient? Yes  SHORT TERM GOALS: Target date: 10/11/2024   Pt will be independent with HEP in order to demonstrate increased ability to perform tasks related to occupation/hobbies. Baseline: Pt given desensitization exercise. Goal status: INITIAL  LONG TERM GOALS: Target date: 11/08/2024  Pt will decrease worst ankle pain by at least 3 points on the NPRS in order to demonstrate clinically significant reduction in ankle pain. Baseline: 7/10 at worst Goal status: INITIAL  2.  Patient will improve the LEFS by 11% in order to demonstrate functional independence without pain. Baseline: 23/80; 28.7% Goal status: INITIAL  3.  Patient will demonstrate improved functional ankle and foot performance by increasing their  FADI score to >=65%, indicating meaningful improvement in pain, mobility, and ability to perform activities of daily living. Baseline: 35% at IE Goal status: INITIAL  4.  Patient will improve right lower extremity single-leg stance time to at least 12 seconds, demonstrating improved balance, strength, and neuromuscular control to reduce fall risk and improve functional mobility. Baseline: 2.79 sec on the R LE Goal status: INITIAL   PLAN:  PT FREQUENCY: 2x/week  PT DURATION: 8 weeks  PLANNED INTERVENTIONS: Therapeutic exercises, Therapeutic activity, Neuromuscular re-education,  Balance training, Gait training, Patient/Family education, Self Care, Joint mobilization, Joint manipulation, Vestibular training, Canalith repositioning, Orthotic/Fit training, DME instructions, Dry Needling, Electrical stimulation, Spinal manipulation, Spinal mobilization, Cryotherapy, Moist heat, Taping, Traction, Ultrasound, Ionotophoresis 4mg /ml Dexamethasone , Manual therapy, and Re-evaluation.  PLAN FOR NEXT SESSION:   ***  Perform toe yoga and other ankle exercises to improve ROM and strengthening of the ankle.   Fonda Simpers, PT, DPT Physical Therapist - [facility]  09/26/24, 8:03 AM  "

## 2024-10-03 ENCOUNTER — Ambulatory Visit

## 2024-10-04 ENCOUNTER — Ambulatory Visit: Admitting: General Practice

## 2024-10-04 ENCOUNTER — Encounter: Payer: Self-pay | Admitting: General Practice

## 2024-10-04 VITALS — BP 122/82 | HR 72 | Temp 98.3°F | Ht 62.0 in | Wt 134.0 lb

## 2024-10-04 DIAGNOSIS — F17200 Nicotine dependence, unspecified, uncomplicated: Secondary | ICD-10-CM

## 2024-10-04 DIAGNOSIS — R1319 Other dysphagia: Secondary | ICD-10-CM

## 2024-10-04 DIAGNOSIS — Z202 Contact with and (suspected) exposure to infections with a predominantly sexual mode of transmission: Secondary | ICD-10-CM

## 2024-10-04 DIAGNOSIS — E039 Hypothyroidism, unspecified: Secondary | ICD-10-CM

## 2024-10-04 DIAGNOSIS — Z853 Personal history of malignant neoplasm of breast: Secondary | ICD-10-CM

## 2024-10-04 DIAGNOSIS — F1010 Alcohol abuse, uncomplicated: Secondary | ICD-10-CM

## 2024-10-04 DIAGNOSIS — F322 Major depressive disorder, single episode, severe without psychotic features: Secondary | ICD-10-CM

## 2024-10-04 DIAGNOSIS — F419 Anxiety disorder, unspecified: Secondary | ICD-10-CM

## 2024-10-04 NOTE — Progress Notes (Signed)
 "  Established Patient Office Visit  Subjective   Patient ID: Frances Adams, female    DOB: 08-24-1976  Age: 49 y.o. MRN: [id number]  Chief Complaint  Patient presents with   chronic care management    Patient here today to follow up on chronic conditions; patient would also like std testing today.    HPI  Frances Adams is a 49 year old female with past medical history of dysphagia, hypothyroidism, hx of breast cancer, anxiety, smoker, alcohol abuse, MDD presents today for chronic care management.   Her daughter is also present today.  Discussed the use of AI scribe software for clinical note transcription with the patient, who gave verbal consent to proceed.  History of Present Illness Frances Adams is a 49 year old female who presents for follow-up and STD testing. She is accompanied by her daughter.  She has a history of thyroid  issues and has stopped her thyroid  medication. She has stopped her thyroid  medication and will have lab work to check her thyroid  levels, including T4, T3, and TSH.  She experiences ongoing difficulty swallowing and was previously referred to a gastroenterologist. An endoscopy performed last year showed no significant abnormalities, but she was prescribed Vanatol and dicyclomine  to help with symptoms of abdominal spasms. She has not been taking these medications and has not followed up with the gastroenterologist.  She has a history of breast cancer and is currently following up with her breast cancer doctor. She has a mammogram scheduled for Friday.  She reports stomach pain and possible exposure to STDs due to seeing multiple partners. She is undergoing testing for herpes, syphilis, hepatitis B and C, HIV, gonorrhea, and chlamydia.  She has a history of depression and previously tried Zoloft, which she states made her feel 'crazy'. She is not currently taking any medication for depression and is not interested in trying new  medications or mental health therapy.  She smokes about two cigarettes a day and drinks alcohol.    Patient Active Problem List   Diagnosis Date Noted   Encounter for screening and preventative care 05/19/2024   Adenomatous polyp of colon 11/17/2023   Other dysphagia 11/17/2023   Severe major depressive disorder (HCC) 10/12/2023   Alcohol abuse 10/12/2023   Malignant neoplasm of right female breast (HCC) 09/23/2023   Hypothyroidism 09/23/2023   Abdominal pain 09/23/2023   Lymphadenopathy, supraclavicular 09/23/2023   Anxiety 09/08/2023   Special screening for malignant neoplasms, colon 09/08/2023   Smoker 09/08/2023   History of breast cancer 08/09/2016   Past Medical History:  Diagnosis Date   Asthma    Breast cancer (HCC) 2004   right breast cancer - chemotherapy and radiation   Breast cancer (HCC) 2008   right breast cancer - mammo site radiation, triple negative   Cancer (HCC)    RT BREAST   Depression    Personal history of chemotherapy 2004   Personal history of radiation therapy 2004   Personal history of radiation therapy 2008   mammosite   Screening examination for venereal disease 09/08/2023   Past Surgical History:  Procedure Laterality Date   ABDOMINAL HYSTERECTOMY     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   BREAST EXCISIONAL BIOPSY Right 2007   calcs, benign   BREAST LUMPECTOMY Right 2004   breast ca   BREAST SURGERY     COLONOSCOPY WITH PROPOFOL  N/A 11/17/2023   Procedure: COLONOSCOPY WITH PROPOFOL ;  Surgeon: Therisa Bi, MD;  Location: [facility] ENDOSCOPY;  Service: Gastroenterology;  Laterality: N/A;   ESOPHAGEAL DILATION  11/17/2023   Procedure: DILATION, ESOPHAGUS;  Surgeon: Therisa Bi, MD;  Location: [facility] ENDOSCOPY;  Service: Gastroenterology;;   ESOPHAGOGASTRODUODENOSCOPY (EGD) WITH PROPOFOL  N/A 11/17/2023   Procedure: ESOPHAGOGASTRODUODENOSCOPY (EGD) WITH PROPOFOL ;  Surgeon: Therisa Bi, MD;  Location: [facility] ENDOSCOPY;  Service: Gastroenterology;   Laterality: N/A;   I & D EXTREMITY Right 05/13/2019   Procedure: IRRIGATION AND DEBRIDEMENT MIDDLE FINGER;  Surgeon: Lorretta Dess, MD;  Location: [facility] OR;  Service: Plastics;  Laterality: Right;   PERCUTANEOUS PINNING Right 05/13/2019   Procedure: PERCUTANEOUS PINNING;  Surgeon: Lorretta Dess, MD;  Location: [facility] OR;  Service: Plastics;  Laterality: Right;   PERINEAL LACERATION REPAIR N/A 07/25/2019   Procedure: REPAIR OF VAGINAL LACERATION;  Surgeon: Leonce Garnette BIRCH, MD;  Location: [facility] ORS;  Service: Gynecology;  Laterality: N/A;   POLYPECTOMY  11/17/2023   Procedure: POLYPECTOMY;  Surgeon: Therisa Bi, MD;  Location: [facility] ENDOSCOPY;  Service: Gastroenterology;;   Allergies[1]       10/04/2024    3:09 PM 05/19/2024    2:02 PM 11/16/2023    2:52 PM  Depression screen PHQ 2/9  Decreased Interest 1 1 1   Down, Depressed, Hopeless 1 2 2   PHQ - 2 Score 2 3 3   Altered sleeping 3 1 2   Tired, decreased energy 1 3 2   Change in appetite 3 3 3   Feeling bad or failure about yourself  1 0 1  Trouble concentrating 3 3 2   Moving slowly or fidgety/restless 0 1 3  Suicidal thoughts 0 0 0  PHQ-9 Score 13 14  16    Difficult doing work/chores Very difficult Not difficult at all Somewhat difficult     Data saved with a previous flowsheet row definition       10/04/2024    3:10 PM 05/19/2024    2:03 PM 11/16/2023    2:53 PM 10/12/2023   12:28 PM  GAD 7 : Generalized Anxiety Score  Nervous, Anxious, on Edge 2 3  3  3    Control/stop worrying 2 3  3  3    Worry too much - different things 2 3  3  3    Trouble relaxing 2 3  3  3    Restless 2 3  3  3    Easily annoyed or irritable 1 3  3  3    Afraid - awful might happen 1 3  3  3    Total GAD 7 Score 12 21 21 21   Anxiety Difficulty Very difficult Not difficult at all Very difficult Somewhat difficult     Data saved with a previous flowsheet row definition      Review of Systems  Constitutional:  Negative for chills and fever.  Respiratory:   Negative for shortness of breath.   Cardiovascular:  Negative for chest pain.  Gastrointestinal:  Negative for abdominal pain, constipation, diarrhea, heartburn, nausea and vomiting.  Genitourinary:  Negative for dysuria, frequency and urgency.  Neurological:  Negative for dizziness and headaches.  Endo/Heme/Allergies:  Negative for polydipsia.  Psychiatric/Behavioral:  Negative for depression and suicidal ideas. The patient is not nervous/anxious.       Objective:     BP 122/82   Pulse 72   Temp 98.3 F (36.8 C) (Temporal)   Ht 5' 2 (1.575 m)   Wt 134 lb (60.8 kg)   SpO2 98%   BMI 24.51 kg/m  BP Readings from Last 3 Encounters:  10/04/24 122/82  05/19/24  116/60  04/27/24 102/65   Wt Readings from Last 3 Encounters:  10/04/24 134 lb (60.8 kg)  08/15/24 126 lb (57.2 kg)  06/20/24 126 lb (57.2 kg)      Physical Exam Vitals and nursing note reviewed.  Constitutional:      Appearance: Normal appearance.  Cardiovascular:     Rate and Rhythm: Normal rate and regular rhythm.     Pulses: Normal pulses.     Heart sounds: Normal heart sounds.  Pulmonary:     Effort: Pulmonary effort is normal.     Breath sounds: Normal breath sounds.  Neurological:     Mental Status: She is alert and oriented to person, place, and time.  Psychiatric:        Mood and Affect: Mood normal.        Behavior: Behavior normal.        Thought Content: Thought content normal.        Judgment: Judgment normal.      No results found for any visits on 10/04/24.     The 10-year ASCVD risk score (Arnett DK, et al., 2019) is: 2.8%    Assessment & Plan:  Possible exposure to STD -     RPR W/RFLX TO RPR TITER, TREPONEMAL AB, SCREEN AND DIAGNOSIS -     C. trachomatis/N. gonorrhoeae RNA -     HIV Antibody (routine testing w rflx) -     Hepatitis C antibody -     Herpes Simplex Virus 1 and 2 (IgG), with Reflex to HSV-2 Inhibition  Hypothyroidism, unspecified type -     CBC -      Comprehensive metabolic panel with GFR -     TSH -     T4, free -     T3, free  Smoker  Anxiety  Severe major depressive disorder (HCC)  Alcohol abuse    Assessment and Plan Assessment & Plan Sexually transmitted infection screening and possible exposure Possible STD exposure due to multiple partners. Screening warranted for herpes, syphilis, hepatitis B, C, HIV, gonorrhea, and chlamydia. - Ordered screening tests for herpes, syphilis, hepatitis B, C, HIV, gonorrhea, and chlamydia.  Hypothyroidism Previous medication non-compliance. Thyroid  function tests needed to assess current status. Possible medication restart or specialist referral based on results. - Ordered thyroid  function tests (T4, T3, TSH). - If thyroid  levels are high, will consider restarting medication. - If thyroid  levels are severely elevated, will refer to a thyroid  specialist.  Major depressive disorder and anxiety Severe major depressive disorder with previous medication non-compliance. Zoloft not tolerated, Lexapro declined. Currently in therapy.  Alcohol abuse Continued alcohol use despite breast cancer history, increasing recurrence risk. - Advised on risks of alcohol consumption, especially given history of breast cancer.  Nicotine dependence Continued smoking, approximately half a pack per day. Smoking cessation advised but not pursued. - Advised on smoking cessation.   Return in about 7 months (around 05/20/2025) for physical and fasting labs.SABRA Carrol Aurora, NP    [1]  Allergies Allergen Reactions   Other     Pt states she has an allergy to IV pain medication - unsure the medicine   "

## 2024-10-04 NOTE — Patient Instructions (Addendum)
 Stop by the lab prior to leaving today. I will notify you of your results once received.   Follow up in September, 2026.   It was a pleasure to see you today!

## 2024-10-05 ENCOUNTER — Ambulatory Visit

## 2024-10-05 ENCOUNTER — Ambulatory Visit: Payer: Self-pay | Admitting: General Practice

## 2024-10-05 DIAGNOSIS — E039 Hypothyroidism, unspecified: Secondary | ICD-10-CM

## 2024-10-05 LAB — CBC
HCT: 40.5 % (ref 36.0–46.0)
Hemoglobin: 13.8 g/dL (ref 12.0–15.0)
MCHC: 33.9 g/dL (ref 30.0–36.0)
MCV: 96.4 fl (ref 78.0–100.0)
Platelets: 269 10*3/uL (ref 150.0–400.0)
RBC: 4.21 Mil/uL (ref 3.87–5.11)
RDW: 14.2 % (ref 11.5–15.5)
WBC: 6.9 10*3/uL (ref 4.0–10.5)

## 2024-10-05 LAB — COMPREHENSIVE METABOLIC PANEL WITH GFR
ALT: 13 U/L (ref 3–35)
AST: 17 U/L (ref 5–37)
Albumin: 4.7 g/dL (ref 3.5–5.2)
Alkaline Phosphatase: 74 U/L (ref 39–117)
BUN: 14 mg/dL (ref 6–23)
CO2: 29 meq/L (ref 19–32)
Calcium: 9.5 mg/dL (ref 8.4–10.5)
Chloride: 101 meq/L (ref 96–112)
Creatinine, Ser: 0.92 mg/dL (ref 0.40–1.20)
GFR: 74.14 mL/min
Glucose, Bld: 85 mg/dL (ref 70–99)
Potassium: 4.7 meq/L (ref 3.5–5.1)
Sodium: 137 meq/L (ref 135–145)
Total Bilirubin: 0.8 mg/dL (ref 0.2–1.2)
Total Protein: 7.4 g/dL (ref 6.0–8.3)

## 2024-10-05 LAB — T3, FREE: T3, Free: 3.2 pg/mL (ref 2.3–4.2)

## 2024-10-05 LAB — T4, FREE: Free T4: 0.58 ng/dL — ABNORMAL LOW (ref 0.60–1.60)

## 2024-10-05 LAB — TSH: TSH: 3.76 u[IU]/mL (ref 0.35–5.50)

## 2024-10-06 ENCOUNTER — Ambulatory Visit: Admission: RE | Admit: 2024-10-06 | Source: Ambulatory Visit

## 2024-10-06 DIAGNOSIS — Z1231 Encounter for screening mammogram for malignant neoplasm of breast: Secondary | ICD-10-CM

## 2024-10-06 LAB — HEPATITIS C ANTIBODY: Hepatitis C Ab: NONREACTIVE

## 2024-10-06 LAB — C. TRACHOMATIS/N. GONORRHOEAE RNA
C. trachomatis RNA, TMA: NOT DETECTED
N. gonorrhoeae RNA, TMA: NOT DETECTED

## 2024-10-06 LAB — SYPHILIS: RPR W/REFLEX TO RPR TITER AND TREPONEMAL ANTIBODIES, TRADITIONAL SCREENING AND DIAGNOSIS ALGORITHM: RPR Ser Ql: NONREACTIVE

## 2024-10-10 ENCOUNTER — Ambulatory Visit

## 2024-10-12 ENCOUNTER — Ambulatory Visit

## 2024-10-17 ENCOUNTER — Ambulatory Visit

## 2024-10-19 ENCOUNTER — Ambulatory Visit

## 2024-10-24 ENCOUNTER — Ambulatory Visit

## 2024-10-26 ENCOUNTER — Ambulatory Visit

## 2024-10-31 ENCOUNTER — Ambulatory Visit

## 2024-11-02 ENCOUNTER — Ambulatory Visit

## 2024-11-07 ENCOUNTER — Ambulatory Visit

## 2024-11-09 ENCOUNTER — Ambulatory Visit

## 2024-11-14 ENCOUNTER — Ambulatory Visit

## 2024-11-14 ENCOUNTER — Ambulatory Visit: Admitting: Podiatry

## 2024-11-16 ENCOUNTER — Ambulatory Visit: Admitting: General Practice

## 2024-11-16 ENCOUNTER — Ambulatory Visit

## 2024-11-21 ENCOUNTER — Ambulatory Visit

## 2024-11-23 ENCOUNTER — Ambulatory Visit

## 2025-05-21 ENCOUNTER — Encounter: Admitting: General Practice
